# Patient Record
Sex: Female | Born: 1990 | Race: White | Hispanic: No | Marital: Single | Smoking: Current every day smoker
Health system: Southern US, Community
[De-identification: ages and names within clinical notes are randomized; demographics above are authoritative.]

## PROBLEM LIST (undated history)

## (undated) DIAGNOSIS — F845 Asperger's syndrome: Secondary | ICD-10-CM

## (undated) DIAGNOSIS — Z8774 Personal history of (corrected) congenital malformations of heart and circulatory system: Secondary | ICD-10-CM

## (undated) DIAGNOSIS — Z9889 Other specified postprocedural states: Secondary | ICD-10-CM

## (undated) DIAGNOSIS — K219 Gastro-esophageal reflux disease without esophagitis: Secondary | ICD-10-CM

## (undated) DIAGNOSIS — F849 Pervasive developmental disorder, unspecified: Secondary | ICD-10-CM

## (undated) DIAGNOSIS — F329 Major depressive disorder, single episode, unspecified: Secondary | ICD-10-CM

## (undated) DIAGNOSIS — F32A Depression, unspecified: Secondary | ICD-10-CM

## (undated) DIAGNOSIS — F319 Bipolar disorder, unspecified: Secondary | ICD-10-CM

## (undated) DIAGNOSIS — F419 Anxiety disorder, unspecified: Secondary | ICD-10-CM

## (undated) DIAGNOSIS — D821 Di George's syndrome: Secondary | ICD-10-CM

## (undated) HISTORY — PX: CARDIAC SURGERY: SHX584

---
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
## 2008-04-28 ENCOUNTER — Emergency Department (HOSPITAL_COMMUNITY): Admission: EM | Admit: 2008-04-28 | Discharge: 2008-04-28 | Payer: Self-pay | Admitting: Emergency Medicine

## 2008-12-15 ENCOUNTER — Other Ambulatory Visit: Payer: Self-pay | Admitting: Emergency Medicine

## 2008-12-16 ENCOUNTER — Inpatient Hospital Stay (HOSPITAL_COMMUNITY): Admission: AD | Admit: 2008-12-16 | Discharge: 2008-12-25 | Payer: Self-pay | Admitting: Psychiatry

## 2008-12-18 ENCOUNTER — Ambulatory Visit: Payer: Self-pay | Admitting: Psychiatry

## 2009-01-27 ENCOUNTER — Emergency Department (HOSPITAL_COMMUNITY): Admission: EM | Admit: 2009-01-27 | Discharge: 2009-01-28 | Payer: Self-pay | Admitting: Emergency Medicine

## 2009-08-29 ENCOUNTER — Emergency Department (HOSPITAL_COMMUNITY): Admission: EM | Admit: 2009-08-29 | Discharge: 2009-08-31 | Payer: Self-pay | Admitting: Emergency Medicine

## 2009-11-06 ENCOUNTER — Inpatient Hospital Stay (HOSPITAL_COMMUNITY): Admission: EM | Admit: 2009-11-06 | Discharge: 2009-11-15 | Payer: Self-pay | Admitting: Emergency Medicine

## 2009-11-26 ENCOUNTER — Emergency Department (HOSPITAL_COMMUNITY): Admission: EM | Admit: 2009-11-26 | Discharge: 2009-11-26 | Payer: Self-pay | Admitting: Emergency Medicine

## 2010-08-19 ENCOUNTER — Emergency Department (HOSPITAL_COMMUNITY): Admission: EM | Admit: 2010-08-19 | Discharge: 2010-08-19 | Payer: Self-pay | Admitting: Family Medicine

## 2010-10-20 IMAGING — CT CT HEAD W/O CM
1 series · 16 of 30 positions shown, 20 images · non-contrast
Comparison: None

CLINICAL DATA: Altered mental status.  Overdose.

CT HEAD WITHOUT CONTRAST
TECHNIQUE: Contiguous axial images were obtained from the base of
the skull through the vertex without contrast.

[Series 2: head_seq 4.5 h37s st · axial · 0.43mm/px · z∈[-94,+50]mm · 16 of 36 slices shown, 20 images]
[im 2/36  brain]
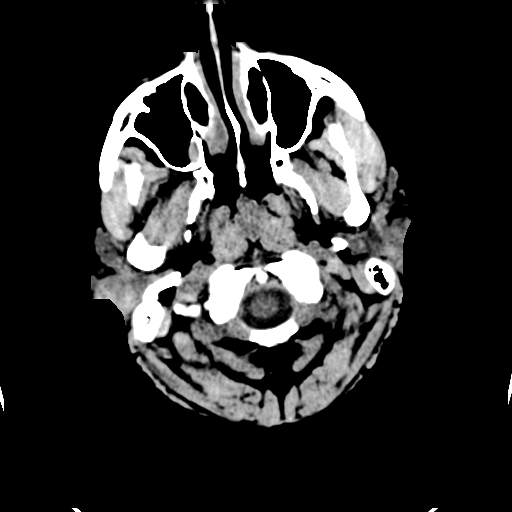
[im 2/36  bone]
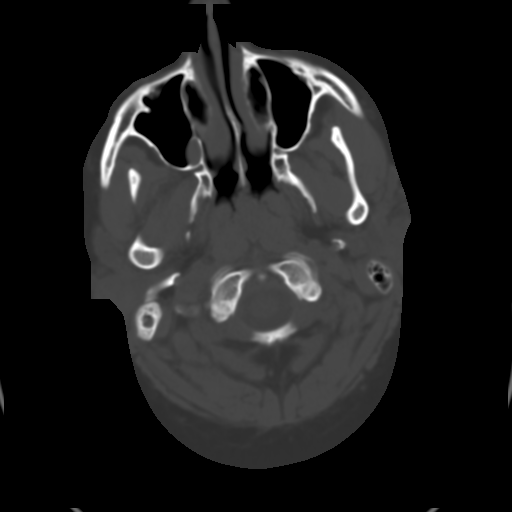
[im 4/36  brain]
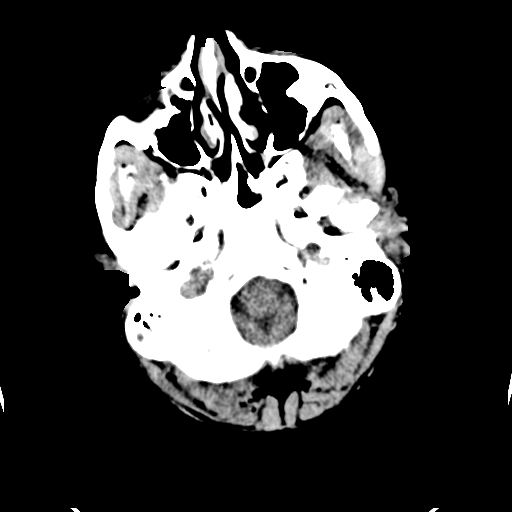
[im 7/36  brain]
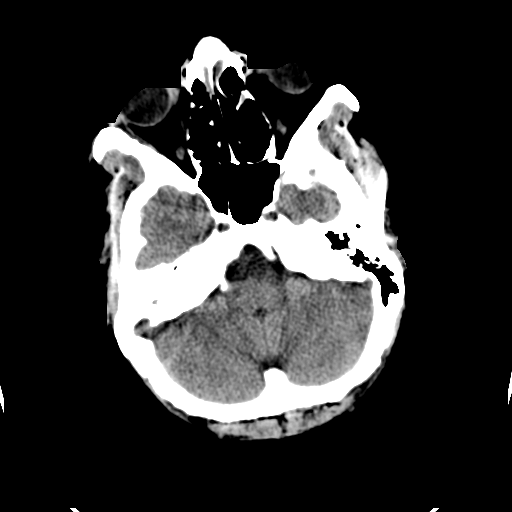
[im 9/36  brain]
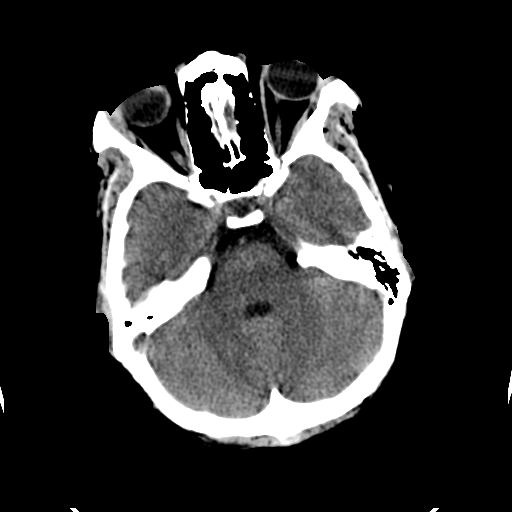
[im 10/36  brain]
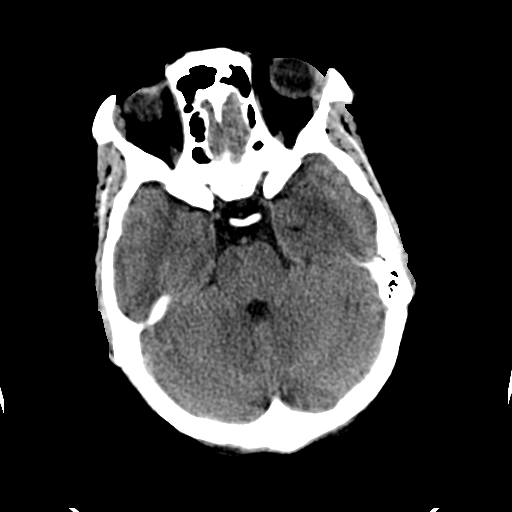
[im 10/36  bone]
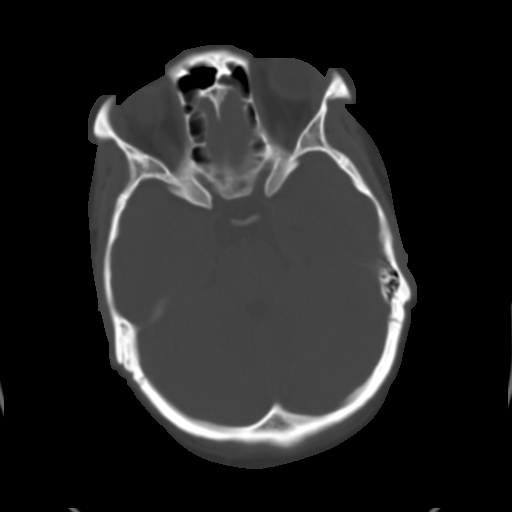
[im 13/36  brain]
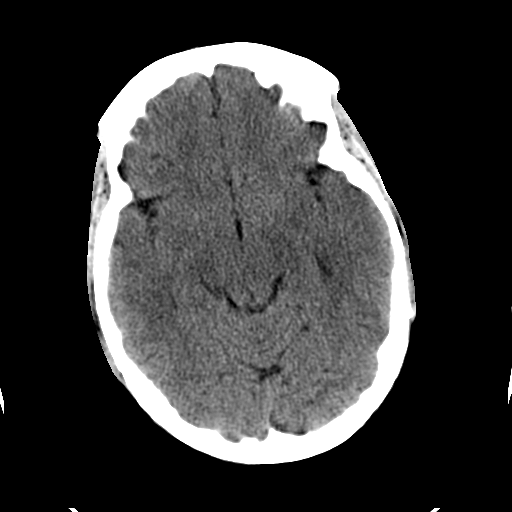
[im 15/36  brain]
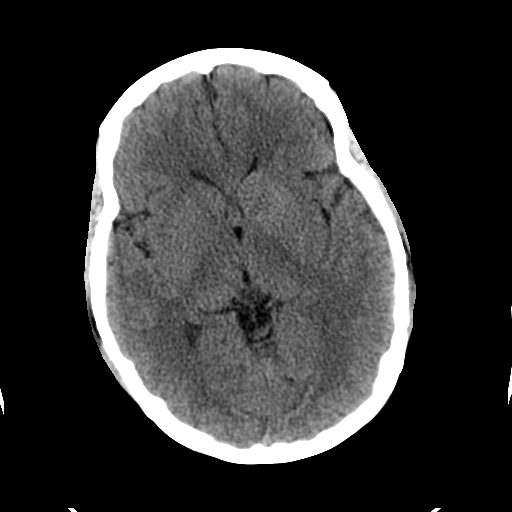
[im 17/36  brain]
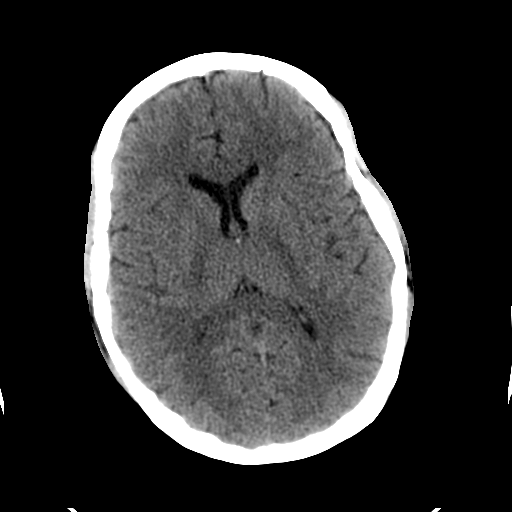
[im 19/36  brain]
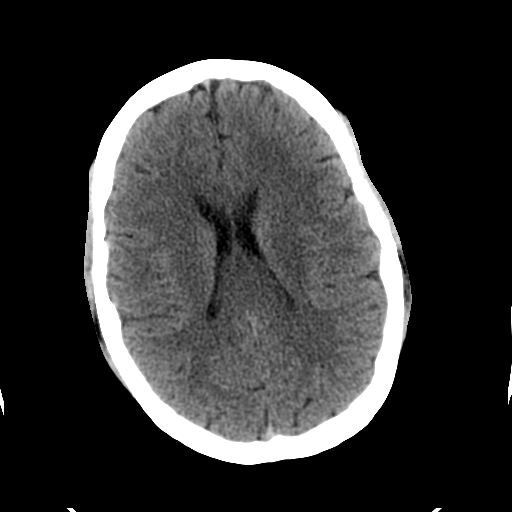
[im 19/36  bone]
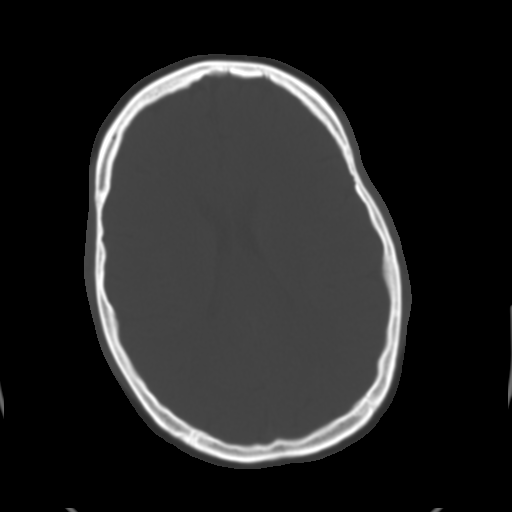
[im 21/36  brain]
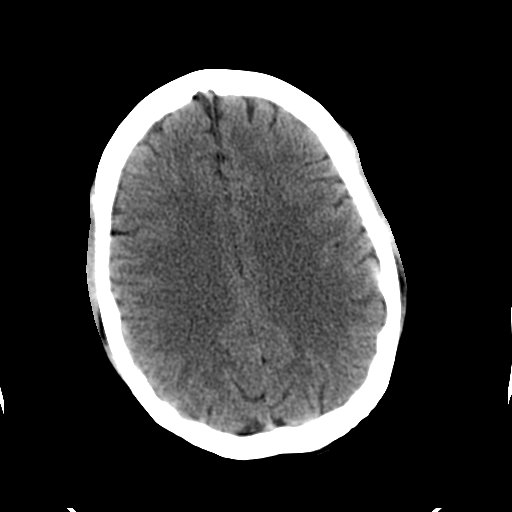
[im 23/36  brain]
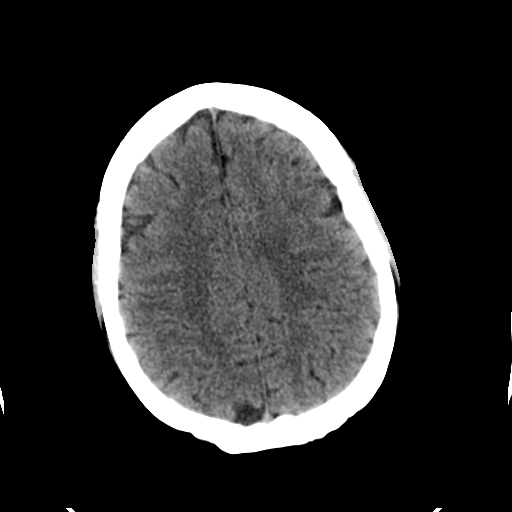
[im 26/36  brain]
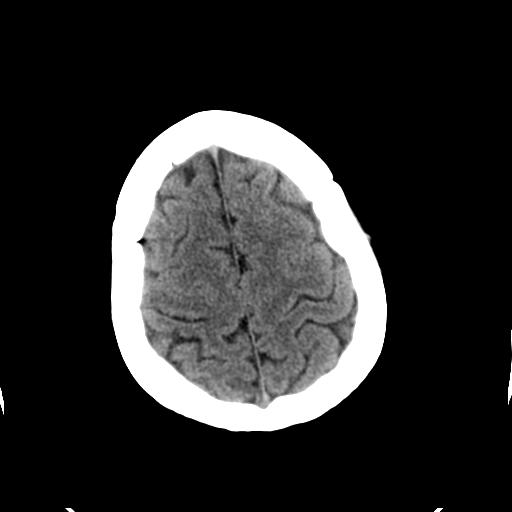
[im 27/36  brain]
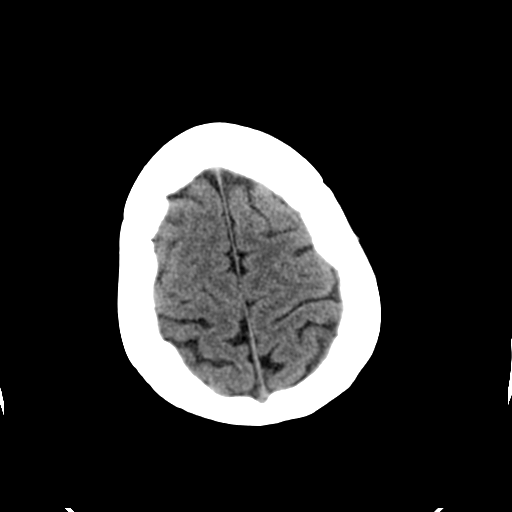
[im 27/36  bone]
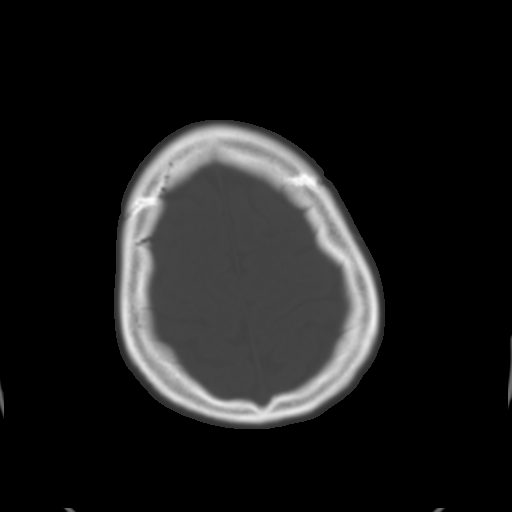
[im 29/36  brain]
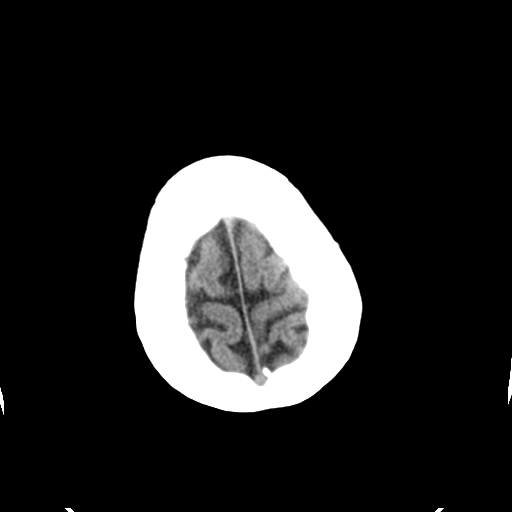
[im 32/36  brain]
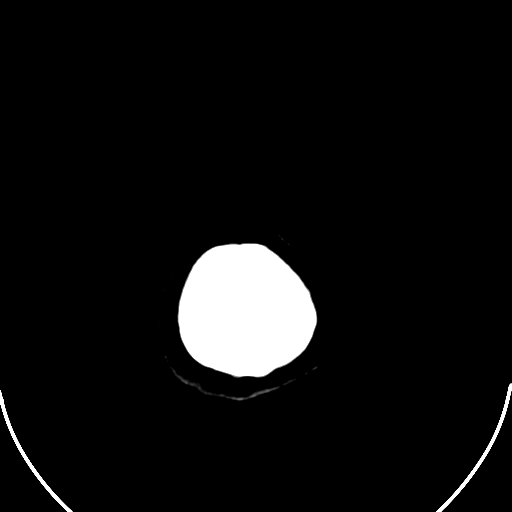
[im 34/36  brain]
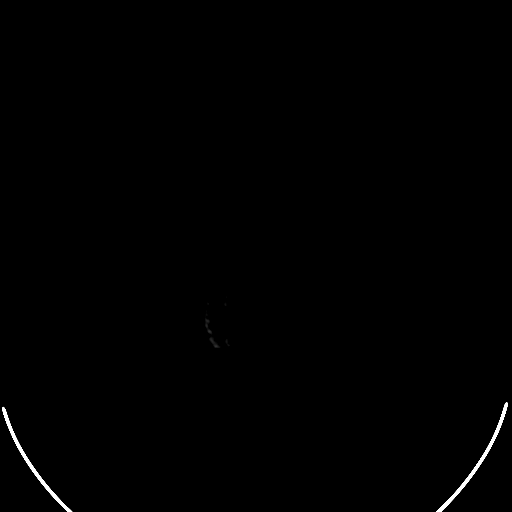

[16 of 30 positions shown; findings below may reference images not displayed]

FINDINGS: No acute intracranial abnormalities are identified,
including mass lesion or mass effect, hydrocephalus, extra-axial
fluid collection, midline shift, hemorrhage, or acute infarction.
Please note that acute infarction may be occult on CT for 24-48
hours.

The visualized bony calvarium is unremarkable.

Fluid within the right mastoid air cells and right middle ear
noted.
IMPRESSION: No evidence of acute intracranial abnormality.

Fluid within right mastoid air cells and right middle ear - may
represent right mastoiditis/otitis.

## 2010-11-29 ENCOUNTER — Emergency Department (HOSPITAL_BASED_OUTPATIENT_CLINIC_OR_DEPARTMENT_OTHER)
Admission: EM | Admit: 2010-11-29 | Discharge: 2010-11-29 | Payer: Self-pay | Source: Home / Self Care | Admitting: Emergency Medicine

## 2011-01-02 ENCOUNTER — Inpatient Hospital Stay (INDEPENDENT_AMBULATORY_CARE_PROVIDER_SITE_OTHER)
Admission: RE | Admit: 2011-01-02 | Discharge: 2011-01-02 | Disposition: A | Discharge status: Home / Self Care | Payer: Medicaid Other | Source: Ambulatory Visit | Attending: Family Medicine | Admitting: Family Medicine

## 2011-01-02 DIAGNOSIS — N390 Urinary tract infection, site not specified: Secondary | ICD-10-CM

## 2011-01-02 LAB — POCT URINALYSIS DIPSTICK
Ketones, ur: NEGATIVE mg/dL
Nitrite: NEGATIVE
Specific Gravity, Urine: >=1.03 (ref 1.005–1.030)
pH: 5.5 (ref 5.0–8.0)

## 2011-01-02 LAB — POCT PREGNANCY, URINE: Preg Test, Ur: NEGATIVE

## 2011-02-05 LAB — POCT RAPID STREP A (OFFICE): Streptococcus, Group A Screen (Direct): NEGATIVE

## 2011-02-24 LAB — COMPREHENSIVE METABOLIC PANEL
ALT: 12 U/L (ref 0–35)
AST: 18 U/L (ref 0–37)
AST: 21 U/L (ref 0–37)
Albumin: 3.7 g/dL (ref 3.5–5.2)
Alkaline Phosphatase: 99 U/L (ref 39–117)
BUN: 8 mg/dL (ref 6–23)
Calcium: 8.9 mg/dL (ref 8.4–10.5)
Chloride: 104 mEq/L (ref 96–112)
Chloride: 109 mEq/L (ref 96–112)
Creatinine, Ser: 0.61 mg/dL (ref 0.4–1.2)
GFR calc Af Amer: >60 mL/min (ref >60)
GFR calc Af Amer: >60 mL/min (ref >60)
GFR calc non Af Amer: >60 mL/min (ref >60)
Glucose, Bld: 89 mg/dL (ref 70–99)
Potassium: 3.9 mEq/L (ref 3.5–5.1)
Potassium: 3.9 mEq/L (ref 3.5–5.1)
Sodium: 137 mEq/L (ref 135–145)
Total Protein: 7.1 g/dL (ref 6.0–8.3)

## 2011-02-24 LAB — DIFFERENTIAL
Eosinophils Relative: 2 % (ref 0–5)
Monocytes Absolute: 0.5 10*3/uL (ref 0.1–1.0)

## 2011-02-24 LAB — HEPATIC FUNCTION PANEL
ALT: 12 U/L (ref 0–35)
AST: 16 U/L (ref 0–37)
Alkaline Phosphatase: 105 U/L (ref 39–117)
Bilirubin, Direct: 0.1 mg/dL (ref 0.0–0.3)
Indirect Bilirubin: 0.2 mg/dL — ABNORMAL LOW (ref 0.3–0.9)

## 2011-02-24 LAB — RAPID URINE DRUG SCREEN, HOSP PERFORMED
Benzodiazepines: NOT DETECTED
Cocaine: NOT DETECTED

## 2011-02-24 LAB — CBC
Hemoglobin: 12.3 g/dL (ref 12.0–15.0)
Hemoglobin: 12.5 g/dL (ref 12.0–15.0)
MCHC: 33.5 g/dL (ref 30.0–36.0)
MCV: 86.3 fL (ref 78.0–100.0)
Platelets: 171 10*3/uL (ref 150–400)
WBC: 6.6 10*3/uL (ref 4.0–10.5)

## 2011-02-24 LAB — URINE CULTURE: Colony Count: NO GROWTH

## 2011-02-24 LAB — URINALYSIS, ROUTINE W REFLEX MICROSCOPIC
Bilirubin Urine: NEGATIVE
Protein, ur: NEGATIVE mg/dL
Urobilinogen, UA: 0.2 mg/dL (ref 0.0–1.0)

## 2011-02-24 LAB — BLOOD GAS, VENOUS
Bicarbonate: 25.9 mEq/L — ABNORMAL HIGH (ref 20.0–24.0)
O2 Saturation: 71.7 %
TCO2: 22.8 mmol/L (ref 0–100)
pO2, Ven: 40.8 mmHg (ref 30.0–45.0)

## 2011-02-24 LAB — GLUCOSE, CAPILLARY: Glucose-Capillary: 94 mg/dL (ref 70–99)

## 2011-02-24 LAB — SALICYLATE LEVEL: Salicylate Lvl: <4 mg/dL (ref 2.8–20.0)

## 2011-02-24 LAB — POCT PREGNANCY, URINE: Preg Test, Ur: NEGATIVE

## 2011-02-24 LAB — ETHANOL: Alcohol, Ethyl (B): <5 mg/dL (ref 0–10)

## 2011-02-24 LAB — TRICYCLICS SCREEN, URINE: TCA Scrn: NOT DETECTED

## 2011-02-26 LAB — BASIC METABOLIC PANEL
BUN: 10 mg/dL (ref 6–23)
Creatinine, Ser: 0.6 mg/dL (ref 0.4–1.2)
Potassium: 3.3 mEq/L — ABNORMAL LOW (ref 3.5–5.1)
Sodium: 140 mEq/L (ref 135–145)

## 2011-02-26 LAB — DIFFERENTIAL
Basophils Absolute: 0 10*3/uL (ref 0.0–0.1)
Lymphocytes Relative: 16 % (ref 12–46)
Lymphs Abs: 1.4 10*3/uL (ref 0.7–4.0)
Monocytes Absolute: 0.8 10*3/uL (ref 0.1–1.0)
Neutro Abs: 6 10*3/uL (ref 1.7–7.7)

## 2011-02-26 LAB — RAPID URINE DRUG SCREEN, HOSP PERFORMED
Benzodiazepines: POSITIVE — AB
Cocaine: NOT DETECTED
Tetrahydrocannabinol: NOT DETECTED

## 2011-02-26 LAB — CBC
Hemoglobin: 13.1 g/dL (ref 12.0–15.0)
RBC: 4.48 MIL/uL (ref 3.87–5.11)
RDW: 12.9 % (ref 11.5–15.5)
WBC: 8.7 10*3/uL (ref 4.0–10.5)

## 2011-02-26 LAB — URINALYSIS, ROUTINE W REFLEX MICROSCOPIC
Glucose, UA: NEGATIVE mg/dL
Protein, ur: NEGATIVE mg/dL
Specific Gravity, Urine: 1.025 (ref 1.005–1.030)
Urobilinogen, UA: 0.2 mg/dL (ref 0.0–1.0)

## 2011-02-26 LAB — URINE MICROSCOPIC-ADD ON

## 2011-02-26 LAB — POCT PREGNANCY, URINE: Preg Test, Ur: NEGATIVE

## 2011-03-07 ENCOUNTER — Inpatient Hospital Stay (INDEPENDENT_AMBULATORY_CARE_PROVIDER_SITE_OTHER)
Admission: RE | Admit: 2011-03-07 | Discharge: 2011-03-07 | Disposition: A | Discharge status: Home / Self Care | Payer: Medicaid Other | Source: Ambulatory Visit | Attending: Family Medicine | Admitting: Family Medicine

## 2011-03-07 DIAGNOSIS — J40 Bronchitis, not specified as acute or chronic: Secondary | ICD-10-CM

## 2011-03-09 LAB — COMPREHENSIVE METABOLIC PANEL
ALT: 15 U/L (ref 0–35)
Albumin: 4.2 g/dL (ref 3.5–5.2)
Alkaline Phosphatase: 99 U/L (ref 39–117)
BUN: 7 mg/dL (ref 6–23)
Potassium: 3.8 mEq/L (ref 3.5–5.1)
Sodium: 138 mEq/L (ref 135–145)
Total Protein: 7.7 g/dL (ref 6.0–8.3)

## 2011-03-09 LAB — URINALYSIS, ROUTINE W REFLEX MICROSCOPIC
Bilirubin Urine: NEGATIVE
Ketones, ur: NEGATIVE mg/dL
Nitrite: NEGATIVE
Urobilinogen, UA: 0.2 mg/dL (ref 0.0–1.0)

## 2011-03-09 LAB — DIFFERENTIAL
Basophils Absolute: 0 10*3/uL (ref 0.0–0.1)
Basophils Relative: 0 % (ref 0–1)
Eosinophils Absolute: 0.7 10*3/uL (ref 0.0–0.7)
Eosinophils Relative: 6 % — ABNORMAL HIGH (ref 0–5)
Lymphs Abs: 1.2 10*3/uL (ref 0.7–4.0)
Monocytes Absolute: 1 10*3/uL (ref 0.1–1.0)
Monocytes Relative: 9 % (ref 3–12)
Myelocytes: 0 %
Neutro Abs: 8.4 10*3/uL — ABNORMAL HIGH (ref 1.7–7.7)
Neutrophils Relative %: 74 % (ref 43–77)

## 2011-03-09 LAB — CBC
Platelets: 185 10*3/uL (ref 150–400)
RDW: 13.4 % (ref 11.5–15.5)

## 2011-03-09 LAB — RAPID URINE DRUG SCREEN, HOSP PERFORMED
Benzodiazepines: NOT DETECTED
Cocaine: NOT DETECTED
Tetrahydrocannabinol: NOT DETECTED

## 2011-03-09 LAB — ETHANOL: Alcohol, Ethyl (B): <5 mg/dL (ref 0–10)
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
## 2011-04-10 NOTE — Discharge Summary (Signed)
Judy Burns, Judy Burns          ACCOUNT NO.:  [id number]   MEDICAL RECORD NO.:  [id number]          PATIENT TYPE:  INP   LOCATION:  0101                          FACILITY:  [facility]   PHYSICIAN:  Lalla Brothers, MDDATE OF BIRTH:  [birth year]   DATE OF ADMISSION:  12/16/2008  DATE OF DISCHARGE:  12/25/2008                               DISCHARGE SUMMARY   IDENTIFICATION:  This 20 year old female 11th grade student at [employer] was admitted emergently voluntarily initially to  the adult psychiatric unit from [facility]  Department, but then transferred to the adolescent psychiatric unit 24  hours after admission for inpatient stabilization and treatment of  suicide risk, depression, and anxious and regressive fixations on the  death of more relatives and the meaning for current and future  relationships.  For full details, please see the typed admission  assessment.   SYNOPSIS OF PRESENT ILLNESS:  The patient has complicated outpatient  therapy relative to her developmental limitations, multiple deaths with  bereavement, and ambivalence about individuation separation.  The  patient has consolidated most of her care at [facility] since her  last hospitalization at [facility] in November 2006 and  one prior to that in April 2005.  She had an inpatient stay at [facility]  in 20-Apr-2008.  The patient formulates that she must be a devil worshiper in  that she must have guilt for the death of 3 relatives or relatives to  be.  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  Mother  and patient have both been grieving the death of maternal grandfather  dying of cancer in 09-29-09maternal aunt's death from a  narcotic  overdose in 05/29/2009and maternal uncle's girlfriend dying 24 hours  after and in the room next to maternal grandfather.  The patient again  blamed herself for the death, feeling that confronting the aunt's  problems may have contributed to her overdose.  The patient questioned  whether she herself was alive or dead and had similar fantasies about  the aunt.  Mother and the patient interpreted that the patient needed  more therapy and had more mental illness.  Mother did indicate that the  patient's IQ had been retested in Sep 29, 2008at 41, having been  measured at 46 in the past.  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  The patient has been  treated in the past with Klonopin, Risperdal, Trileptal, Abilify,  Celexa, Lunesta and Seroquel, being at the time of admission on Zoloft  75 mg at bedtime and trazodone 100 mg at bedtime.  IQ had reportedly  been 68 in 1996-04-20.  The patient has had phonological disorder, ADHD  symptoms and Asperger  symptoms in addition to her mild mental  retardation, though mother significantly denies limitations, especially  when working with the patient.   INITIAL MENTAL STATUS EXAM:  The patient is right-handed.  She has  cognitive slowing and speech limitations.  She appeared depressed,  involuted, regressed and infantile.  She had mood lability but no  current mania.  She seems to regress most when the family becomes over-  nurturing.  She did not manifest overt psychosis.  She did have suicidal  ideation to overdose or cut herself and has a history of cutting and  overdosing and reports some purging.   LABORATORY FINDINGS:  CBC in the emergency department was normal except  white count elevated at 11,300 with upper limit of normal 10,500, and  there were 6%  eosinophils with upper limit of normal 5.  Hemoglobin was  normal at 13, MCV at 85.9 and platelet count 185,000.  Comprehensive  metabolic panel was normal with sodium 138, potassium 3.8, random  glucose 103, creatinine 0.52, calcium 9.4, AST 19 and ALT 15.  Blood  alcohol was negative as was urine drug screen.  Urinalysis revealed  large amount of occult blood from menses with specific gravity of 1.014,  otherwise clear, with microscopic exam revealing too numerous to count  RBC and rare epithelial.  Urine pregnancy test was negative.   HOSPITAL COURSE AND TREATMENT:  General medical exam in the emergency  department was sufficient for the patient's hospitalization on the adult  psychiatric unit from which she was transferred to the adolescent  psychiatric unit.  She is overweight and had superficial lacerations  self-inflicted to both forearms.  She had been cutting herself and  attempted to take pills as an overdose at a friend's house who called  mother relative to securing patient's transportation to the emergency  department.  Vital signs were normal throughout hospital stay with  maximum temperature 99.5 three days prior to discharge.  Her height was  62 inches and weight was 97.1 kg on admission, becoming 99 kg by  discharge.  Initial supine blood pressure was 113/68 with heart rate of  61, and standing blood pressure 138/79 with heart rate of 81.  At the  time of discharge on discharge medication, supine blood pressure was  113/68 with heart rate of 58, and standing blood pressure 113/78 with  heart rate of 71.  The patient was initially fixated on whether she was  alive or dead, though by the second hospital day she was asking for  something to help her breathe better.  Mother noted the patient seeks  inhalers as do peers at school relative to substance abuse proclivity,  though the patient has no substance abuse.  The patient was comfortable  with a normal saline nasal inhaler  and felt this helped her breathe very  well during hospital stay.  As she was reminded that her need to breathe  means she is alive, the patient gradually became engaged in milieu and  group therapies with improved relations with peers and self-  satisfaction.  but also increasing dependence on relationships in the  hospital.  Mother was faced at the same time with introducing the  patient to respite care at a medical nursing home by admitting her there  from the hospital.  The patient was initially regressing and then  oppositionally protesting such placement plans, but by the time of  discharge she was accepting and cooperative and such.  Mother noted that  she had  suddenly resolved her grief shortly before the patient's  admission.  The patient gradually relinquished her fixation on grief  with its tactile, visual and auditory delusions of the deceased aunt in  the course of the hospital stay.  She became more interested in peer-  based activities and discussions and felt the need to continue at the  hospital relative to the achievement socially.  The patient and mother  therefore attempted to maintain aftercare therapies with inpatient  staff, but such was declined, as the patient and mother were assured  that their progress was real and need not be propped up or supported  other than by existing therapies and services from prior to admission.  The patient was discharged free of suicidal ideation on Zoloft 150 mg  every bedtime, and her trazodone, coping much more effectively.  Mother  had less expressed emotion but was still validating of the patient's  primitive fantasy intrusions at the time of discharge, though the  patient was slowly relinquishing these.  She required no seclusion or  restraint during the hospital stay.  Over the course of the  hospitalization it seemed clear that she had developed a major  depressive episode superimposed on her grief and that her generalized   anxiety had exacerbated.  She was not considered clinically psychotic.   FINAL DIAGNOSES:  Axis I:  A.  Major depression recurrent, severe  B.  Generalized anxiety disorder.  C.  Attention deficit hyperactivity disorder, not otherwise specified,  by history (provisional diagnosis).  D.  Other interpersonal problem.  E.  Parent child problem.  F.  Other specified family circumstances.  G.  Noncompliance with psychotherapies.  Axis II:  A.  Mild mental retardation.  B.  Phonological disorder by history.  C.  Pervasive developmental disorder, not otherwise specified  (provisional diagnosis).  Axis III:  A.  Obesity.  B.  Tetralogy of Fallot, status post surgery, now with right bundle  branch block.  C.  Right hemispheric stroke with focal seizures at the time of cardiac  surgery at age 30 or 17.  D.  Velocardiofacial syndrome (DiGeorge).  E.  Asthma.  F.  Acne.  G.  Allergy to carrots manifested by rash and itching.  Axis IV:  Stressors:  Family severe, acute and chronic; medical severe,  acute and chronic; phase of life extreme, acute and chronic; school  severe, acute and chronic.  Axis V:  GAF on admission 30 with highest in the last year 54 and  discharge GAF was 47.   PLAN:  The patient was discharged to mother in improved condition free  of suicide and homicide ideation.  She follows a weight-control diet and  has no restrictions on physical activity other than to abstain from any  self-injury or substance use.  She requires no wound care or pain  management at the time of discharge.  She is encouraged to disengage  from the intensity of grief counseling as she has begun to accept, as  mother has, the loss of the aunt, and hopefully can transfer her focus  in therapy to real life social skill and relational building, as this  has been her area of interest in the hospital stay.   She is discharged on the following medication:  1. Zoloft 100 mg tablet, taking 1-1/2 tablets  every bedtime, quantity      #45 with no refill prescribed.  2. Trazodone 100 mg every bedtime, quantity #30 with no refill  prescribed.   They see Dr. Milus Glazier for therapy next on December 25, 2008 at 1700 at  [phone].  They see Dr. Esaw Dace for psychiatric followup January 21, 2009 at  1630 at [phone].  She does not need the normal saline nasal spray at  the time of discharge.      Lalla Brothers, MD  Electronically Signed     GEJ/MEDQ  D:  12/27/2008  T:  12/28/2008  Job:  [id number]   cc:   [employer] Psychiatry Department [facility] [place], [place] [address]  Fax [phone]
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
## 2011-04-10 NOTE — H&P (Signed)
Judy Burns, Judy Burns          ACCOUNT NO.:  [id number]   MEDICAL RECORD NO.:  [id number]          PATIENT TYPE:  INP   LOCATION:  0104                          FACILITY:  [facility]   PHYSICIAN:  Lalla Brothers, MDDATE OF BIRTH:  1991/04/04   DATE OF ADMISSION:  10/04/2005  DATE OF DISCHARGE:                         PSYCHIATRIC ADMISSION ASSESSMENT   IDENTIFICATION:  A 20 year old female, 9th grade student at [employer], is admitted emergently, voluntarily in transfer from [facility] Emergency Department for inpatient stabilization and treatment  of Lunesta overdose suicide attempt that is confusing as to reason and  mechanism.  The patient reportedly overdosed with 22 tablets of Lunesta 3 mg  each between 12 and 48 hours prior to admission.  She reported a progressive  fear of drug dealers and Goth peers recently so that she had not slept for 3  days.  She simultaneously clarifies that school is a Designer, television/film set for  her self esteem and documentation of her function, at the same time that she  is reporting that these threatening type peers are killing her.   HISTORY OF PRESENT ILLNESS:  The patient presents with a chief concern of  being manic, though as she is admitted, her Celexa is maintained by Dr.  Katrinka Blazing at 20 mg nightly and Lunesta is maintained at 3 mg nightly.  The  patient has known me from hospitalization at the [facility]  February 21, 2004 through February 26, 2004, at which time she had unspecified  psychotic symptoms and significant anxiety.  Mother provides some updates or  changes in the patient's background and chronological history at the time of  this readmission.  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  The mother reports that the patient's IQ is either 53, 67, or  55, rather than the previous 31, though it is difficult to  determine whether  mother is aware that she reported the IQ of 4 from testing in 1997 at the  time of the last admission in March of 2005.  Mother is more clarifying of  the patient's medical trauma and complications in the past.  The patient  does not have cleft lip often associated with velocardiofacial syndrome, but  she does have learning difficulties and cardiac difficulties as well as  anxious difficulties that could be associated.  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  She had a right hemispheric stroke, according to mother,  perioperatively or postoperatively, which may have been the source of focal  seizures that apparently occurred for years,  according to mother.  They  emphasize the goal of 40 pound weight reduction over the next year at the  time of admission, though the patient has reduced her weight from last  hospitalization at 186 pounds to current 175 pounds, therefore 11 pounds  over a year and a half.  The patient is obsessive, agitated, and overwhelmed  with her thoughts on this admission as she had  been last admission.  She is  not expansive, grandiose or elevated in mood.  Rather she is uncomfortable,  fearful, and stressed as she describes paranoid delusions that are vague and  poorly formed but organized around [place] peers and drug dealers.  The patient  is admitted assessing whether sleep and functioning can be restored in the  inpatient milieu and programming.  The patient had been discharged on  Abilify 15 mg nightly in addition to Zoloft 25 mg daily as of her last  hospitalization in March to April of 2005.  She apparently continues therapy  with Terrial Rhodes as well as continuing her medical care with Dr. Octavio Graves in [facility].  Dr. Ellamae Sia had phoned during the patient's  last hospitalization with diagnoses and treatment targets.  The patient was  working outpatient with  Dr. Elmo Putt since 2003 at the time of her last  hospitalization.  She had an early diagnosis of ADHD not otherwise  specified.  The patient has not received an in-hospital diagnosis of mania  or depression, though mother indicates she herself has had bipolar  depression in the past and seems to expect the patient will have the same.  The patient is on Celexa 20 mg every bedtime and Lunesta 3 mg every bedtime  at the time of admission, with the Lunesta present for the last 2 months.  The patient does value opening up and talking about her stressors, though  she becomes disorganized and anxious as she does so.  Still, she seems to  start some preparations for stabilization as she discusses such.  Dr.  Milford Cage is monitoring the patient's medications currently on an  outpatient basis.  In the past, the patient has been treated with Klonopin,  Risperdal, Trileptal and then the Abilify and Zoloft.  They do not clarify  why the Abilify was stopped but it may have related to weight gain and  potential seizure diathesis.  The patient may have also improved in the  interim.  She uses no alcohol or illicit drugs.  She is not sexualized in  her behavior or in her wishes or desires for the future.  She lives with  mother and mother's fiance.  She had speech therapy in the past.  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  The patient does not open up and  discuss all of these health concerns.   PAST MEDICAL HISTORY:  The patient is also under the medical care of Dr.  Carlean Purl and Dr. Margaretmary Lombard.  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  The patient apparently had cardiac failure at the  time of  surgery.  She is currently menstruating and menarche was at age 46.  She is  not sexually active.  She is trying to lose 40 more pounds over the next  year.  She has had some asthma in the past, though she is requiring no  treatment now.  She has no medication allergies.  She is on Celexa 20 mg  every bedtime and Lunesta 3 mg every bedtime.   REVIEW OF SYSTEMS:  The patient denies difficulty with gait, gaze or  continence currently.  She denies exposure to communicable disease  or  toxins.  She denies rash, jaundice or purpura.  There is no chest pain,  palpitations, or presyncope currently.  There is no dyspnea, cough or  palpitations.  There is no abdominal pain, nausea, vomiting or diarrhea.  There is no dysuria or arthralgia.   Immunizations are up to date.   FAMILY HISTORY:  The patient resides with mother and mother's fiance.  Mother reports having bipolar depression herself in the past.  Mother is not  definitely on medications currently.  They do not offer any information  about the patient's biological father.   SOCIAL AND DEVELOPMENTAL HISTORY:  The patient is a 9th grade student at  [employer].  She had speech therapy in the past.  Mother  reported an IQ in 1997 of 44, according to the patient's last admission in  March of 2005.  Mother reports values of 78, 79 and 89 as recorded in the  nursing intake during this admission.  The patient is not sexualized in  behavior nor does she have sexualized interests.  She is currently anxious,  particularly about peers in school.  She suggests that [place] and drug dealer  type peers frighten her severely and now she is afraid they are going to  harm her in some way.  She has no legal charges.  She uses no alcohol or  illicit drugs herself.   ASSETS:  The patient is seeking direction and containment as well as  reassurance and education.   MENTAL STATUS EXAM:  Height is 60 inches, down from 62 inches  measured at  the time of her last hospitalization.  Weight is 175 pounds, down from 186  pounds at the time of her last admission.  Blood pressure is 129/90 with  heart rate of 92 sitting and 137/85 with heart rate of 90 standing.  The  patient is alert.  She was considered disoriented at the time of admission  but she currently is oriented to person, place, time and situation.  However, the patient is disorganized in thought.  Muscle strength and tone  are normal.  There are no pathological reflexes or soft neurologic findings.  There are no abnormal involuntary movements.  Alternating motion rates are  intact.  Gait and gaze are intact.  The patient is agitated and anxious,  with some disorganized flight of ideas and paranoid delusions, as well as  paranoia seeming likely.  She is sleep deprived.  She did allow labs to be  drawn in the emergency department after refusing any venipuncture during her entire hospitalization in 2005.  She has had an apparent suicide attempt.  She does not clarify thoughts or commands surrounding this.  However, she  does acknowledge being overwhelmed with her own fears and her thoughts of  how others are going to be harmful to her.  She does not have grandiose  delusions nor erotomanic delusions.  She does not present significant  evidence of mania, as much as she seems to present unspecified psychosis  with paranoid delusions and a history of generalized and organic anxiety.  Her suicidality seems to be associated with these continued symptoms.   IMPRESSION:  AXIS 1:  1.  Psychotic disorder not otherwise specified.  2.  Generalized anxiety disorder.  3.  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  4.  History of attention deficit hyperactivity disorder not otherwise  specified.  5.  Rule out bipolar disorder, manic with psychotic symptoms  (provisional      diagnosis).  6.  Other interpersonal problem.  7.  Other specified family circumstances.  AXIS II:  1.  Rule out Asperger's  syndrome (provisional diagnosis).  2.  Rule out mild mental retardation (provisional diagnosis).  3.  Rule out learning disorder not otherwise specified (provisional      diagnosis).  4.  Rule out history of phonological disorder.  AXIS III:  1.  Lunesta overdose.  2.  Status post cardiac surgery for tetralogy of Fallot.  3.  History of perioperative or postoperative right hemispheric stroke at      the time of cardiac surgery with possible subsequent focal seizures.  4.  Overweight.  5.  History of asthma.  6.  History of acne.  AXIS IV:  Stressors:  Family moderate, acute and chronic; medical severe,  acute and chronic; phase of life extreme, acute and chronic; school severe,  acute and chronic.  AXIS V:  Global assessment of function on admission 28 with highest in last  year 54.   PLAN:  The patient is admitted for inpatient adolescent psychiatric and  multidisciplinary, multimodal behavioral health treatment in a team-based  programmatic locked psychiatric unit.  We will consider a typical  antipsychotic again, it is allowed, though, this may have been  contraindicated either for weight, cardiac, or clinical improvement as  discussed previously by Dr. Ellamae Sia.  Will need to stop Celexa if the patient  is definitely manic.  At this time, will monitor and provide  psychotherapeutic stabilization to determine other medication management  absolutely necessary.  Cognitive behavioral therapy, coping with chronic  illness, identity consolidation, individuation-separation, learning  strategies, desensitization, and family therapy can be undertaken.  Estimated length of stay is 7 days with target symptoms for discharge being  stabilization of suicide risk and psychotic symptoms, stabilization of mood and anxiety symptoms, and generalization of  the capacity for safe and  effective participation in outpatient treatment.      Lalla Brothers, MD  Electronically Signed     GEJ/MEDQ  D:  10/05/2005  T:  10/05/2005  Job:  [id number]

## 2011-04-10 NOTE — Discharge Summary (Signed)
Judy Burns, Judy Burns                      ACCOUNT NO.:  [id number]   MEDICAL RECORD NO.:  [id number]                   PATIENT TYPE:  INP   LOCATION:  0454                                 FACILITY:  [facility]   PHYSICIAN:  Beverly Milch, MD                  DATE OF BIRTH:  [birth year]   DATE OF ADMISSION:  02/21/2004  DATE OF DISCHARGE:  02/26/2004                                 DISCHARGE SUMMARY   IDENTIFICATION:  A [age]-year[age]old female, 7th grade student at [employer], was admitted emergently, voluntarily on referral from Dr. Lamar Blinks for inpatient stabilization of paranoid delusions and obsessive and  generalized anxiety upon which she was beginning to act, including by  carrying a knife to combat the things that were after her.  This was her  first psychiatric hospitalization and she has worked with Dr. Nicholaus Bloom since  March 21, 2002, though mother has been lost to follow-up during the winter  and has not been complying with Trileptal which she has reduced from 600 to  300 mg nightly.  For full details please see the typed admission assessment.   SYNOPSIS OF PRESENT ILLNESS:  Mother and patient have been diligent in their  compliance with cardiology and genetics follow-ups, as confirmed by Dr.  Ellamae Sia by phone.  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  The patient has a previous  diagnosis of ADHD and has been treated in the past with Klonopin and  Risperdal.  Mother has depression possibly of the bipolar type as well as  panic attacks, and father has a substance abuse history.  The patient has a  measured IQ of 68 in 1997 according to mother, and mother at various times  states the patient functions at a 5th grade but then subsequently at an 20-  year-old level.  The patient had cardiac surgery for Tetralogy of Fallo at 79  months of age and is quite phobic about having venipuncture, preferring to  do the test  herself.  She had menarche at age 20 and has gained excessive  weight.  She has a history of focal seizures in the past but has been free  of any seizure activity for many years.   INITIAL MENTAL STATUS EXAM:  The patient was circumstantial and obsessively  over elaborating her fears at the time of admission and stated she did not  sleep at all the first night, as she was anticipating venipuncture the next  morning.  The patient was admitted to the children's unit instead of the  adolescents' unit by staff decision.  She was noted to have significant  dependence upon mother for activities for which she is responsible as well  as relational needs.  She described persecutory and paranoid delusions and  was vigilant.  She did not have acknowledged auditory or visual  hallucinations though mother suspected such.  LABORATORY FINDINGS:  Only a urinalysis was completed relative to admission  labs.  Two separate attempts at venipuncture were carried out, including by  preparations of having mother present for the second one and most  experienced laboratory phlebotomy staff.  Though the patient did allow 2  separate attempts at venipuncture the second time, the vein was not accessed  except for a few drops of blood and not enough to carry out laboratory  testing of a routine nature to assure internal organ preparedness for  pharmacotherapy.  Mother therefore requested that any blood work necessary  be deferred to Dr. Carma Leaven lab and she thinks Dr. Ellamae Sia may have some  blood stored that would be applicable to assuring hepatic, hematopoietic and  other organ and metabolic functional capacity for the patient to take her  antipsychotic medications.  She did have a normal urinalysis at the  [facility], with specific gravity of 1.019.  EKG was carried  out when stable on 15 mg of Abilify nightly and 25 mg of Zoloft in the  morning.  The patient's conduction intervals were safe to  continue these  medications, with a QTC of 404 milliseconds.  She did have abnormalities on  the EKG suggestive of right ventricular hypertrophy and stain and left  atrial enlargement, though the study was performed only to assess conduction  variables relative to psychotropic medications.   HOSPITAL COURSE AND TREATMENT:  General medical exam by [facility],  PA-C noted no medication allergies.  The patient had a right upper extremity  fracture in the past.  The patient had watched The Ring movie and was  fixated on violent themes prior to admission.  The patient had scars from  previous cutdowns of the left wrist and open heart surgery.  She had  menarche at age 50, with irregular menses.  She has headaches when she cries  and tantrums when she is upset.  She is overweight.  Her admission weight  was 186 pounds with height of 62 inches, blood pressure 101/62 with heart  rate of 76 sitting and 126/73 with heart rate of 129 standing.  The patient  was started on Zyprexa the first night at 5 mg nightly and tolerated this  well but was switched to Abilify after considering all the variables and  discussing with Dr. Nicholaus Bloom.  Trileptal was discontinued and Abilify was  titrated up to 15 mg nightly.  She was started on a low dose of Zoloft 25 mg  every morning though with a history that she had been over activated and  destabilized when taking Prozac remotely in the past, before even Risperdal  or Klonopin.  By February 24, 2004, the patient became significantly improved.  Her obsessive and persecutory delusional fixations were significantly  contained.  She became much more social and verbal in her participation in  all aspects of treatment.  Mother was pleased with the patient's improvement  and safety and stability.  The patient was able to sustain this for 2 more  days and was discharged in improved condition.  The patient was able to review the letter she wrote me from the evening of  admission in a somewhat  mechanical way, though addressing the persecutory fears and obsessive over  interpretations with the ability to formulate by the time of discharge but  she could use the cognitive behavioral and interpersonal coping skills that  she had acquired during the hospital treatment to deal with relapses in her  symptoms should  such occur.  She had no seizure activity during hospital  stay.  By the time of discharge, the patient's weight was down to 181  pounds.  Her blood pressure was 111/73 with heart rate of 91 supine and  standing blood pressure 108/78 with heart rate of 78.  She had no suicidal  or homicidal ideation.  She had 48-72 hours of sustained improvement and was  discharged to mother in improved condition.   FINAL DIAGNOSES:  AXIS 1:  1. 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  2. Generalized anxiety disorder with obsessive features, to rule out     obsessive-compulsive disorder.  3. Other specified family circumstances.  4. Relative noncompliance with treatment.  5. Parent-child problem.  AXIS II:  1. Mild mental retardation by history.  2. 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  AXIS III:  1. Velocardiofacial syndrome.  2. History of partial seizures.  3. Overweight.  4. Asthma by history.  5. Mild acne.  6. Status post surgery for Tetralogy of Fallo.  AXIS IV:  Stressors:  Family - moderate to severe, acute and chronic; medical - severe  to extreme, chronic; phase of life - extreme, acute and chronic; school -  severe, acute and chronic.  AXIS V:  Global assessment of function on admission 35 with highest in last year 54  and discharge global assessment of function was 48.   PLAN:  The patient was discharged in improved condition with significant  improvement.  Assessment of Asperger's features was difficult considering  her Velocardiofacial  syndrome diagnosis  as well as her admission for severe  obsessive anxiety and persecutory delusions.  She was improved over the 48  hours prior to discharge and had significant social skills and interests  that were not evident at the time of admission.  She coped much better with  addressing all aspects of her functional problems including with mother at  the time of discharge.  She seemed less dependent on mother at the time of  discharge and the patient was proud herself of her progress during hospital  stay.  She was discharged on the following medications:  1. Zoloft 25 mg every morning, quantity #30 with no refill prescribed.  2. Abilify 15 mg every bedtime, quantity #30 with no refill prescribed.  Her Trileptal was discontinued.  She will see Dola Factor March 17, 2004  at 0900.  She sees Dr. Karel Jarvis March 10, 2004 at 1300 and mother  thinks Dr. Ellamae Sia may have some blood frozen that can be used to assure that the patient has internal organ competence for tolerating pharmacotherapy.  She will see Dr. Lamar Blinks February 29, 2004 at 1420 for psychiatric follow-up.  Crisis and safety plans are outlined.  Weight control diet is outlined and  she has previously established activity levels from cardiology.  There is a  signed release in the chart for the courtesy copies.                                               Beverly Milch, MD    GJ/MEDQ  D:  02/27/2004  T:  02/27/2004  Job:  [id number]   cc:   Attn: Dr. Lamar Blinks and Dola Factor [facility]  [address]  [place],  [place] [address]  fax:  [phone]   Joella Prince, MD  [address]  [place], [place] [address]  fax:  [phone]

## 2011-07-15 ENCOUNTER — Emergency Department: Payer: Self-pay | Admitting: *Deleted

## 2011-09-03 ENCOUNTER — Emergency Department: Payer: Self-pay | Admitting: *Deleted

## 2011-12-23 ENCOUNTER — Inpatient Hospital Stay: Payer: Self-pay | Admitting: Psychiatry

## 2011-12-23 LAB — COMPREHENSIVE METABOLIC PANEL
Alkaline Phosphatase: 90 U/L (ref 50–136)
Anion Gap: 11 (ref 7–16)
BUN: 9 mg/dL (ref 7–18)
Bilirubin,Total: 0.3 mg/dL (ref 0.2–1.0)
Calcium, Total: 9.1 mg/dL (ref 8.5–10.1)
Chloride: 105 mmol/L (ref 98–107)
Co2: 23 mmol/L (ref 21–32)
EGFR (African American): >60
EGFR (Non-African Amer.): >60
Osmolality: 277 (ref 275–301)
Potassium: 3.6 mmol/L (ref 3.5–5.1)

## 2011-12-23 LAB — CBC
HGB: 12.6 g/dL (ref 12.0–16.0)
MCH: 30.4 pg (ref 26.0–34.0)
MCHC: 33.8 g/dL (ref 32.0–36.0)
MCV: 90 fL (ref 80–100)
Platelet: 85 10*3/uL — ABNORMAL LOW (ref 150–440)
RBC: 4.14 10*6/uL (ref 3.80–5.20)

## 2011-12-23 LAB — URINALYSIS, COMPLETE
Leukocyte Esterase: NEGATIVE
Nitrite: NEGATIVE
Ph: 8 (ref 4.5–8.0)
Protein: NEGATIVE
RBC,UR: 1 /HPF (ref 0–5)

## 2011-12-23 LAB — DRUG SCREEN, URINE
Amphetamines, Ur Screen: NEGATIVE (ref ?–1000)
Cannabinoid 50 Ng, Ur ~~LOC~~: NEGATIVE (ref ?–50)
Tricyclic, Ur Screen: POSITIVE (ref ?–1000)

## 2011-12-23 LAB — PREGNANCY, URINE: Pregnancy Test, Urine: NEGATIVE m[IU]/mL

## 2011-12-23 LAB — ETHANOL: Ethanol: <3 mg/dL

## 2011-12-23 LAB — TSH: Thyroid Stimulating Horm: 1.71 u[IU]/mL

## 2011-12-24 LAB — LIPID PANEL
Cholesterol: 122 mg/dL (ref 0–200)
HDL Cholesterol: 32 mg/dL — ABNORMAL LOW (ref 40–60)
Triglycerides: 132 mg/dL (ref 0–200)

## 2011-12-24 LAB — FOLATE: Folic Acid: 22.8 ng/mL (ref 3.1–100.0)

## 2011-12-29 ENCOUNTER — Emergency Department: Payer: Self-pay | Admitting: Emergency Medicine

## 2011-12-29 LAB — CBC
HGB: 13.8 g/dL (ref 12.0–16.0)
MCH: 30.5 pg (ref 26.0–34.0)
MCHC: 34 g/dL (ref 32.0–36.0)
MCV: 90 fL (ref 80–100)
WBC: 18.7 10*3/uL — ABNORMAL HIGH (ref 3.6–11.0)

## 2011-12-29 LAB — URINALYSIS, COMPLETE
Blood: NEGATIVE
Nitrite: NEGATIVE
Protein: NEGATIVE
Specific Gravity: 1.024 (ref 1.003–1.030)

## 2011-12-29 LAB — COMPREHENSIVE METABOLIC PANEL
Albumin: 3.9 g/dL (ref 3.4–5.0)
Alkaline Phosphatase: 99 U/L (ref 50–136)
BUN: 12 mg/dL (ref 7–18)
Bilirubin,Total: 0.4 mg/dL (ref 0.2–1.0)
Creatinine: 0.66 mg/dL (ref 0.60–1.30)
EGFR (African American): >60
EGFR (Non-African Amer.): >60
Glucose: 116 mg/dL — ABNORMAL HIGH (ref 65–99)
Osmolality: 280 (ref 275–301)
SGPT (ALT): 21 U/L
Sodium: 140 mmol/L (ref 136–145)
Total Protein: 7.9 g/dL (ref 6.4–8.2)

## 2011-12-29 LAB — AMYLASE: Amylase: 53 U/L (ref 25–115)

## 2012-03-08 ENCOUNTER — Inpatient Hospital Stay (HOSPITAL_COMMUNITY)
Admission: EM | Admit: 2012-03-08 | Discharge: 2012-03-11 | DRG: 918 | Disposition: A | Discharge status: Home / Self Care | Payer: Medicare Other | Attending: Internal Medicine | Admitting: Internal Medicine

## 2012-03-08 ENCOUNTER — Encounter (HOSPITAL_COMMUNITY): Payer: Self-pay | Admitting: Emergency Medicine

## 2012-03-08 DIAGNOSIS — D696 Thrombocytopenia, unspecified: Secondary | ICD-10-CM | POA: Diagnosis present

## 2012-03-08 DIAGNOSIS — T39094A Poisoning by salicylates, undetermined, initial encounter: Principal | ICD-10-CM | POA: Diagnosis present

## 2012-03-08 DIAGNOSIS — D821 Di George's syndrome: Secondary | ICD-10-CM | POA: Diagnosis present

## 2012-03-08 DIAGNOSIS — F319 Bipolar disorder, unspecified: Secondary | ICD-10-CM | POA: Diagnosis present

## 2012-03-08 DIAGNOSIS — F4329 Adjustment disorder with other symptoms: Secondary | ICD-10-CM | POA: Diagnosis present

## 2012-03-08 DIAGNOSIS — T39091A Poisoning by salicylates, accidental (unintentional), initial encounter: Secondary | ICD-10-CM | POA: Diagnosis present

## 2012-03-08 DIAGNOSIS — E876 Hypokalemia: Secondary | ICD-10-CM | POA: Diagnosis present

## 2012-03-08 DIAGNOSIS — T394X2A Poisoning by antirheumatics, not elsewhere classified, intentional self-harm, initial encounter: Secondary | ICD-10-CM | POA: Diagnosis present

## 2012-03-08 DIAGNOSIS — Y92009 Unspecified place in unspecified non-institutional (private) residence as the place of occurrence of the external cause: Secondary | ICD-10-CM

## 2012-03-08 DIAGNOSIS — F4321 Adjustment disorder with depressed mood: Secondary | ICD-10-CM | POA: Diagnosis present

## 2012-03-08 DIAGNOSIS — F29 Unspecified psychosis not due to a substance or known physiological condition: Secondary | ICD-10-CM | POA: Diagnosis present

## 2012-03-08 DIAGNOSIS — Z6841 Body Mass Index (BMI) 40.0 and over, adult: Secondary | ICD-10-CM

## 2012-03-08 DIAGNOSIS — E663 Overweight: Secondary | ICD-10-CM | POA: Diagnosis present

## 2012-03-08 DIAGNOSIS — F062 Psychotic disorder with delusions due to known physiological condition: Secondary | ICD-10-CM

## 2012-03-08 DIAGNOSIS — R4189 Other symptoms and signs involving cognitive functions and awareness: Secondary | ICD-10-CM | POA: Diagnosis present

## 2012-03-08 DIAGNOSIS — T39011A Poisoning by aspirin, accidental (unintentional), initial encounter: Secondary | ICD-10-CM

## 2012-03-08 HISTORY — DX: Bipolar disorder, unspecified: F31.9

## 2012-03-08 LAB — CBC
Hemoglobin: 14.1 g/dL (ref 12.0–15.0)
MCHC: 33.9 g/dL (ref 30.0–36.0)
Platelets: 140 10*3/uL — ABNORMAL LOW (ref 150–400)
RDW: 12.6 % (ref 11.5–15.5)

## 2012-03-08 LAB — DIFFERENTIAL
Basophils Absolute: 0 10*3/uL (ref 0.0–0.1)
Eosinophils Absolute: 0.8 10*3/uL — ABNORMAL HIGH (ref 0.0–0.7)
Lymphocytes Relative: 17 % (ref 12–46)
Monocytes Absolute: 1 10*3/uL (ref 0.1–1.0)
Neutrophils Relative %: 67 % (ref 43–77)

## 2012-03-08 LAB — COMPREHENSIVE METABOLIC PANEL
ALT: 14 U/L (ref 0–35)
AST: 15 U/L (ref 0–37)
Calcium: 9.4 mg/dL (ref 8.4–10.5)
Creatinine, Ser: 0.66 mg/dL (ref 0.50–1.10)
Sodium: 136 mEq/L (ref 135–145)
Total Protein: 8.2 g/dL (ref 6.0–8.3)

## 2012-03-08 LAB — URINALYSIS, ROUTINE W REFLEX MICROSCOPIC
Glucose, UA: NEGATIVE mg/dL
Hgb urine dipstick: NEGATIVE
Ketones, ur: NEGATIVE mg/dL
Protein, ur: NEGATIVE mg/dL
pH: 7.5 (ref 5.0–8.0)

## 2012-03-08 LAB — ETHANOL: Alcohol, Ethyl (B): <11 mg/dL (ref 0–11)

## 2012-03-08 LAB — URINE MICROSCOPIC-ADD ON

## 2012-03-08 LAB — RAPID URINE DRUG SCREEN, HOSP PERFORMED
Amphetamines: NOT DETECTED
Benzodiazepines: NOT DETECTED
Opiates: NOT DETECTED

## 2012-03-08 LAB — MAGNESIUM: Magnesium: 2.1 mg/dL (ref 1.5–2.5)

## 2012-03-08 LAB — SALICYLATE LEVEL: Salicylate Lvl: 40.8 mg/dL (ref 2.8–20.0)

## 2012-03-08 LAB — POCT PREGNANCY, URINE: Preg Test, Ur: NEGATIVE

## 2012-03-08 MED ORDER — ONDANSETRON HCL 4 MG/2ML IJ SOLN
4.0000 mg | INTRAMUSCULAR | Status: DC | PRN
  Administered 2012-03-08: 4 mg via INTRAVENOUS
  Filled 2012-03-08: qty 2

## 2012-03-08 MED ORDER — SODIUM CHLORIDE 0.9 % IV BOLUS (SEPSIS)
1000.0000 mL | Freq: Once | INTRAVENOUS | Status: AC
  Administered 2012-03-08: 1000 mL via INTRAVENOUS

## 2012-03-08 MED ORDER — SODIUM CHLORIDE 0.9 % IV SOLN
INTRAVENOUS | Status: DC
  Administered 2012-03-09: 03:00:00 via INTRAVENOUS

## 2012-03-08 MED ORDER — DEXTROSE 50 % IV SOLN
1.0000 | Freq: Once | INTRAVENOUS | Status: AC
  Administered 2012-03-08: 50 mL via INTRAVENOUS
  Filled 2012-03-08: qty 50

## 2012-03-08 MED ORDER — CHARCOAL ACTIVATED PO LIQD
50.0000 g | Freq: Once | ORAL | Status: AC
  Administered 2012-03-08: 50 g via ORAL
  Filled 2012-03-08: qty 240

## 2012-03-08 MED ORDER — SODIUM BICARBONATE 8.4 % IV SOLN
INTRAVENOUS | Status: DC
  Administered 2012-03-08 – 2012-03-09 (×2): via INTRAVENOUS
  Filled 2012-03-08 (×8): qty 150

## 2012-03-08 NOTE — ED Notes (Signed)
[id number]-------Tara Montalvo, pt's guardian

## 2012-03-08 NOTE — ED Notes (Signed)
Per EMS-pt took handful of aspirin so she could go see her Aunt who is dead-made herself vomit X4 to get pills up

## 2012-03-08 NOTE — ED Notes (Signed)
ZOX:[id number]<AV> Expected date:<BR> Expected time: 6:47 PM<BR> Means of arrival:Ambulance<BR> Comments:<BR> M130 -- ASA Overdose

## 2012-03-08 NOTE — ED Provider Notes (Signed)
History     CSN: [id number]  Arrival date & time 03/08/12  1843   First MD Initiated Contact with Patient 03/08/12 1902      Chief Complaint  Patient presents with  . Drug Overdose    (Consider location/radiation/quality/duration/timing/severity/associated sxs/prior treatment) HPI  Patient lives in a group home. She has been home visiting her mother because of some roommate problems which mother states has been resolved. She relates they have moved the patient's room so she no longer will be around the person that she was upset about. She relates patient cannot be left alone that she will try to harm herself. She/or how it happened but at about 3:45 PM today patient took a handful of aspirin but she made herself throw up about 4 times. Her mother states she did see some pill particles in the vomitus. Patient states she took the pills out of frustration stating "I wanted to end it all ". She states now she is heartbroken in which she hadn't done what she did. Her mother reports she has started intensive therapy recently and she is just now going through the process of grieving for 3 family members who died within 24 hours of each other about 3 years ago. At which point both patient and her mother start crying. Patient was just admitted to the hospital 3 months ago for medication adjustment. She also recently had her medications changed about 3 or 4 weeks ago. Mother patient is interested in getting patient medically cleared and then hopefully take her back to her group home. She states patient is safe at her group home because medications are locked up.    [facility]  Past Medical History  Diagnosis Date  . 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 No pertinent past surgical history. Tetralogy of Fallot repair at 49 months of age  No family history on file.  History  Substance Use Topics  . Smoking  status: yes  . Smokeless tobacco: Not on file  . Alcohol Use: No  lives in group home  OB History    Grav Para Term Preterm Abortions TAB SAB Ect Mult Living                  Review of Systems  All other systems reviewed and are negative.    Allergies  Review of patient's allergies indicates no known allergies.  Home Medications   Current Outpatient Rx  Name Route Sig Dispense Refill  . FLUOXETINE HCL 20 MG PO CAPS Oral Take 60 mg by mouth daily.    Marland Kitchen HALOPERIDOL 1 MG PO TABS Oral Take 1 mg by mouth 2 (two) times daily.    . ADULT MULTIVITAMIN W/MINERALS CH Oral Take 1 tablet by mouth daily.    . QUETIAPINE FUMARATE 200 MG PO TABS Oral Take 200 mg by mouth at bedtime.      BP 125/63  Pulse 78  Temp 98 F (36.7 C)  Resp 20  SpO2 99%  Vital signs normal    Physical Exam  Nursing note and vitals reviewed. Constitutional: She is oriented to person, place, and time. She appears well-developed and well-nourished.  Non-toxic appearance. She does not appear ill. No distress.  HENT:  Head: Normocephalic and atraumatic.  Right Ear: External ear normal.  Left Ear: External ear normal.  Nose: Nose normal. No mucosal edema or rhinorrhea.  Mouth/Throat: Oropharynx is clear and  moist and mucous membranes are normal. No dental abscesses or uvula swelling.  Eyes: Conjunctivae and EOM are normal. Pupils are equal, round, and reactive to light.  Neck: Normal range of motion and full passive range of motion without pain. Neck supple.  Cardiovascular: Normal rate, regular rhythm and normal heart sounds.  Exam reveals no gallop and no friction rub.   No murmur heard. Pulmonary/Chest: Effort normal and breath sounds normal. No respiratory distress. She has no wheezes. She has no rhonchi. She has no rales. She exhibits no tenderness and no crepitus.  Abdominal: Soft. Normal appearance and bowel sounds are normal. She exhibits no distension. There is no tenderness. There is no rebound and  no guarding.  Musculoskeletal: Normal range of motion. She exhibits no edema and no tenderness.       Moves all extremities well.   Neurological: She is alert and oriented to person, place, and time. She has normal strength. No cranial nerve deficit.  Skin: Skin is warm, dry and intact. No rash noted. No erythema. No pallor.  Psychiatric: She has a normal mood and affect. Her speech is normal and behavior is normal. Her mood appears not anxious.       Cries at times during the interview    ED Course  Procedures (including critical care time)   Medications  0.9 %  sodium chloride infusion (not administered)  sodium bicarbonate 150 mEq in dextrose 5 % 1,000 mL infusion (  Intravenous New Bag/Given 03/08/12 2316)  ondansetron (ZOFRAN) injection 4 mg (4 mg Intravenous Given 03/08/12 2256)  sodium chloride 0.9 % bolus 1,000 mL (1000 mL Intravenous Given 03/08/12 2237)  dextrose 50 % solution 50 mL (50 mL Intravenous Given 03/08/12 2304)  charcoal activated (NO SORBITOL) (ACTIDOSE-AQUA) suspension 50 g (50 g Oral Given 03/08/12 2258)    22:11 Poison Control, give 1 amp D50, start on bicarb drip and monitor U.O.   MOP and patient advised she will need to be admitted medically.   23:10 Dr Norville Haggard states can be admitted by hospitalist and if needs ICU they can follow  Dr Phillips Odor, admit to tele, team 1    Results for orders placed during the hospital encounter of 03/08/12  CBC      Component Value Range   WBC 11.0 (*) 4.0 - 10.5 (K/uL)   RBC 4.73  3.87 - 5.11 (MIL/uL)   Hemoglobin 14.1  12.0 - 15.0 (g/dL)   HCT 16.1  09.6 - 04.5 (%)   MCV 87.9  78.0 - 100.0 (fL)   MCH 29.8  26.0 - 34.0 (pg)   MCHC 33.9  30.0 - 36.0 (g/dL)   RDW 40.9  81.1 - 91.4 (%)   Platelets 140 (*) 150 - 400 (K/uL)  DIFFERENTIAL      Component Value Range   Neutrophils Relative 67  43 - 77 (%)   Lymphocytes Relative 17  12 - 46 (%)   Monocytes Relative 9  3 - 12 (%)   Eosinophils Relative 7 (*) 0 - 5 (%)    Basophils Relative 0  0 - 1 (%)   Neutro Abs 7.3  1.7 - 7.7 (K/uL)   Lymphs Abs 1.9  0.7 - 4.0 (K/uL)   Monocytes Absolute 1.0  0.1 - 1.0 (K/uL)   Eosinophils Absolute 0.8 (*) 0.0 - 0.7 (K/uL)   Basophils Absolute 0.0  0.0 - 0.1 (K/uL)   WBC Morphology MILD LEFT SHIFT (1-5% METAS, OCC MYELO, OCC BANDS)     Smear  Review LARGE PLATELETS PRESENT    COMPREHENSIVE METABOLIC PANEL      Component Value Range   Sodium 136  135 - 145 (mEq/L)   Potassium 3.6  3.5 - 5.1 (mEq/L)   Chloride 102  96 - 112 (mEq/L)   CO2 20  19 - 32 (mEq/L)   Glucose, Bld 88  70 - 99 (mg/dL)   BUN 8  6 - 23 (mg/dL)   Creatinine, Ser 1.61  0.50 - 1.10 (mg/dL)   Calcium 9.4  8.4 - 09.6 (mg/dL)   Total Protein 8.2  6.0 - 8.3 (g/dL)   Albumin 3.9  3.5 - 5.2 (g/dL)   AST 15  0 - 37 (U/L)   ALT 14  0 - 35 (U/L)   Alkaline Phosphatase 119 (*) 39 - 117 (U/L)   Total Bilirubin 0.1 (*) 0.3 - 1.2 (mg/dL)   GFR calc non Af Amer >90  >90 (mL/min)   GFR calc Af Amer >90  >90 (mL/min)  ACETAMINOPHEN LEVEL      Component Value Range   Acetaminophen (Tylenol), Serum <15.0  10 - 30 (ug/mL)  SALICYLATE LEVEL      Component Value Range   Salicylate Lvl 40.8 (*) 2.8 - 20.0 (mg/dL)  ETHANOL      Component Value Range   Alcohol, Ethyl (B) <11  0 - 11 (mg/dL)  URINE RAPID DRUG SCREEN (HOSP PERFORMED)      Component Value Range   Opiates NONE DETECTED  NONE DETECTED    Cocaine NONE DETECTED  NONE DETECTED    Benzodiazepines NONE DETECTED  NONE DETECTED    Amphetamines NONE DETECTED  NONE DETECTED    Tetrahydrocannabinol NONE DETECTED  NONE DETECTED    Barbiturates NONE DETECTED  NONE DETECTED   URINALYSIS, ROUTINE W REFLEX MICROSCOPIC      Component Value Range   Color, Urine YELLOW  YELLOW    APPearance CLOUDY (*) CLEAR    Specific Gravity, Urine 1.007  1.005 - 1.030    pH 7.5  5.0 - 8.0    Glucose, UA NEGATIVE  NEGATIVE (mg/dL)   Hgb urine dipstick NEGATIVE  NEGATIVE    Bilirubin Urine NEGATIVE  NEGATIVE    Ketones, ur  NEGATIVE  NEGATIVE (mg/dL)   Protein, ur NEGATIVE  NEGATIVE (mg/dL)   Urobilinogen, UA 0.2  0.0 - 1.0 (mg/dL)   Nitrite NEGATIVE  NEGATIVE    Leukocytes, UA TRACE (*) NEGATIVE   POCT PREGNANCY, URINE      Component Value Range   Preg Test, Ur NEGATIVE  NEGATIVE   URINE MICROSCOPIC-ADD ON      Component Value Range   Squamous Epithelial / LPF FEW (*) RARE    WBC, UA 3-6  <3 (WBC/hpf)   Bacteria, UA FEW (*) RARE    Urine-Other MUCOUS PRESENT    MAGNESIUM      Component Value Range   Magnesium 2.1  1.5 - 2.5 (mg/dL)   Laboratory interpretation all normal except elevated aspirin    Date: 03/08/2012  Rate: 89  Rhythm: normal sinus rhythm  QRS Axis: normal  Intervals: normal  ST/T Wave abnormalities: nonspecific ST/T changes  Conduction Disutrbances:IRBBB  Narrative Interpretation:   Old EKG Reviewed: none available    1. Aspirin overdose    Plan admission  Devoria Albe, MD, FACEP   CRITICAL CARE Performed by: Devoria Albe L   Total critical care time:33 min  Critical care time was exclusive of separately billable procedures and treating other patients.  Critical  care was necessary to treat or prevent imminent or life-threatening deterioration.  Critical care was time spent personally by me on the following activities: development of treatment plan with patient and/or surrogate as well as nursing, discussions with consultants, evaluation of patient's response to treatment, examination of patient, obtaining history from patient or surrogate, ordering and performing treatments and interventions, ordering and review of laboratory studies, ordering and review of radiographic studies, pulse oximetry and re-evaluation of patient's condition.   MDM          Ward Givens, MD 03/08/12 [phone]

## 2012-03-09 ENCOUNTER — Emergency Department (HOSPITAL_COMMUNITY): Payer: Medicare Other

## 2012-03-09 DIAGNOSIS — F319 Bipolar disorder, unspecified: Secondary | ICD-10-CM

## 2012-03-09 DIAGNOSIS — D821 Di George's syndrome: Secondary | ICD-10-CM | POA: Diagnosis present

## 2012-03-09 DIAGNOSIS — F29 Unspecified psychosis not due to a substance or known physiological condition: Secondary | ICD-10-CM | POA: Diagnosis present

## 2012-03-09 DIAGNOSIS — R4189 Other symptoms and signs involving cognitive functions and awareness: Secondary | ICD-10-CM | POA: Diagnosis present

## 2012-03-09 DIAGNOSIS — T39094A Poisoning by salicylates, undetermined, initial encounter: Principal | ICD-10-CM

## 2012-03-09 DIAGNOSIS — T394X2A Poisoning by antirheumatics, not elsewhere classified, intentional self-harm, initial encounter: Secondary | ICD-10-CM

## 2012-03-09 LAB — BLOOD GAS, VENOUS
Acid-Base Excess: 1.5 mmol/L (ref 0.0–2.0)
Bicarbonate: 23.3 mEq/L (ref 20.0–24.0)
FIO2: 0.21 %
O2 Saturation: 91.6 %
Patient temperature: 98.6
TCO2: 20.4 mmol/L (ref 0–100)
pCO2, Ven: 29.1 mmHg — ABNORMAL LOW (ref 45.0–50.0)
pH, Ven: 7.514 — ABNORMAL HIGH (ref 7.250–7.300)
pO2, Ven: 56.9 mmHg — ABNORMAL HIGH (ref 30.0–45.0)

## 2012-03-09 LAB — GLUCOSE, CAPILLARY
Glucose-Capillary: 101 mg/dL — ABNORMAL HIGH (ref 70–99)
Glucose-Capillary: 96 mg/dL (ref 70–99)
Glucose-Capillary: 109 mg/dL — ABNORMAL HIGH (ref 70–99)

## 2012-03-09 LAB — BASIC METABOLIC PANEL
CO2: 21 mEq/L (ref 19–32)
CO2: 24 mEq/L (ref 19–32)
Chloride: 103 mEq/L (ref 96–112)
Chloride: 103 mEq/L (ref 96–112)
Creatinine, Ser: 0.68 mg/dL (ref 0.50–1.10)
Glucose, Bld: 99 mg/dL (ref 70–99)
Potassium: 3.2 mEq/L — ABNORMAL LOW (ref 3.5–5.1)
Sodium: 139 mEq/L (ref 135–145)
Sodium: 138 mEq/L (ref 135–145)

## 2012-03-09 LAB — SALICYLATE LEVEL
Salicylate Lvl: 27.9 mg/dL — ABNORMAL HIGH (ref 2.8–20.0)
Salicylate Lvl: <2 mg/dL — ABNORMAL LOW (ref 2.8–20.0)

## 2012-03-09 MED ORDER — LORAZEPAM 2 MG/ML IJ SOLN
1.0000 mg | INTRAMUSCULAR | Status: DC | PRN
  Administered 2012-03-09 – 2012-03-11 (×4): 1 mg via INTRAVENOUS
  Filled 2012-03-09 (×4): qty 1

## 2012-03-09 MED ORDER — QUETIAPINE FUMARATE 50 MG PO TABS
250.0000 mg | ORAL_TABLET | Freq: Every day | ORAL | Status: DC
  Administered 2012-03-09 – 2012-03-10 (×2): 250 mg via ORAL
  Filled 2012-03-09 (×4): qty 1

## 2012-03-09 MED ORDER — ONDANSETRON HCL 4 MG PO TABS: 4.0000 mg | ORAL_TABLET | Freq: Four times a day (QID) | ORAL | Status: DC | PRN

## 2012-03-09 MED ORDER — QUETIAPINE FUMARATE 200 MG PO TABS
200.0000 mg | ORAL_TABLET | Freq: Every day | ORAL | Status: DC
  Filled 2012-03-09 (×2): qty 1

## 2012-03-09 MED ORDER — HALOPERIDOL 1 MG PO TABS
1.0000 mg | ORAL_TABLET | Freq: Two times a day (BID) | ORAL | Status: DC
  Administered 2012-03-09 (×2): 1 mg via ORAL
  Filled 2012-03-09 (×5): qty 1

## 2012-03-09 MED ORDER — FLUOXETINE HCL 20 MG PO CAPS
60.0000 mg | ORAL_CAPSULE | Freq: Every day | ORAL | Status: DC
  Administered 2012-03-09 – 2012-03-11 (×3): 60 mg via ORAL
  Filled 2012-03-09 (×4): qty 3

## 2012-03-09 MED ORDER — ADULT MULTIVITAMIN W/MINERALS CH
1.0000 | ORAL_TABLET | Freq: Every day | ORAL | Status: DC
  Administered 2012-03-09 – 2012-03-11 (×3): 1 via ORAL
  Filled 2012-03-09 (×4): qty 1

## 2012-03-09 MED ORDER — POTASSIUM CHLORIDE CRYS ER 20 MEQ PO TBCR
40.0000 meq | EXTENDED_RELEASE_TABLET | Freq: Once | ORAL | Status: AC
  Administered 2012-03-09: 40 meq via ORAL
  Filled 2012-03-09 (×2): qty 2

## 2012-03-09 MED ORDER — ONDANSETRON HCL 4 MG/2ML IJ SOLN: 4.0000 mg | Freq: Four times a day (QID) | INTRAMUSCULAR | Status: DC | PRN

## 2012-03-09 NOTE — Progress Notes (Signed)
CARE MANAGEMENT NOTE 03/09/2012  Patient:  VADIE, PRINCIPATO   Account Number:  [id number]  Date Initiated:  03/09/2012  Documentation initiated by:  Naliya Gish  Subjective/Objective Assessment:   pt with over dose of ASA, hx of bi-polar disorder and PTSS,     Action/Plan:   lives at home   Anticipated DC Date:  03/12/2012   Anticipated DC Plan:  HOME/SELF CARE  In-house referral  NA      DC Planning Services  NA  NA      PAC Choice  NA   Choice offered to / List presented to:  NA   DME arranged  NA      DME agency  NA     HH arranged  NA      HH agency  NA   Status of service:  In process, will continue to follow Medicare Important Message given?  YES (If response is "NO", the following Medicare IM given date fields will be blank) Date Medicare IM given:  03/08/2012 Date Additional Medicare IM given:    Discharge Disposition:    Per UR Regulation:  Reviewed for med. necessity/level of care/duration of stay  If discussed at Long Length of Stay Meetings, dates discussed:    Comments:  [id number]/Jonette Wassel Lorrin Mais Case Management [phone]

## 2012-03-09 NOTE — Progress Notes (Signed)
CSW and psych MD met with Pt's mom prior to meeting with Pt.  Per Pt's mom, Pt is MR, with an IQ of 75 and has attempted suicide 17+ times.  Pt's mom states that Pt currently resides at [facility] group home in [place] and has been there for 6-7 months.  Prior to this placement, Pt has been in 2 other group homes.  Pt and Pt's mom happy with Pt's current placement, save for a new resident that picks on Pt, causing Pt significant distress.  Pt's mom states that she allowed Pt to return home for several days while the group home handled the situation with the new resident.  While at home, Pt bit herself and OD'd on a large quantity of ASA.  Per mom, all medications are locked up and it is unknown how Pt obtained the ASA.  Additionally, Pt was found watching [place]mare on [address] and it's unknown how she obtained this movie, as horror movies serve to upset and agitate Pt, thus she's not supposed to watch these.  Pt receives outpt therapy 3x per week, as well as psychiatric care, at an outpt tx facility in [place].  Pt's mom was upset and exhausted and CSW suggested that she return home to get some sleep.  CSW and psych MD discussed the current situation with Pt.  Pt stated that she overdosed due to being in emotional pain.  Pt explained that her mom doesn't understand what she's going through and her living situation is upsetting to her.  Additionally, Pt states that she is mourning the loss of her aunt, who died 3 years ago.   Pt reports that she is currently engaged to "Gerilyn Pilgrim" and that he brings her great joy.  She states that they met at a dance and brightens when discussing him.  Pt states that she had attempted suicide "way more than 17x" and that she's received inpt tx at [facility] numerous times, with the last stint within the past 6 years.  Pt agreeable to returning to [facility].  Pt denies HI.  Pt's drug screen was negative.  Pt reports seeing "scarey strangers" but was unable  to elaborate further.  CSW and psych MD thanked Pt for her time.  CSW to continue to follow.  Providence Crosby, LCSWA Clinical Social Work [phone]

## 2012-03-09 NOTE — Progress Notes (Signed)
Message from Pt's mom stating that she will be working tomorrow and will not be able to meet with CSW and psych MD in person but that she will be available via phone.  CSW to continue to follow.  Providence Crosby, LCSWA Clinical Social Work [phone]

## 2012-03-09 NOTE — H&P (Signed)
Hospital Admission Note Date: 03/09/2012  Patient name: Judy Burns Medical record number: [id number] Date of birth: [birth year] Age: 21 y.o. Gender: female PCP: No primary provider on file.  Medical Service: [facility] Team 1  Attending physician: A. Elisabeth Pigeon     Chief Complaint: ASA overdose   History of Present Illness: This is an unfortunate 21 year old woman with severe bipolar disease, PTSD, multiple suicide attempts, psychosis/delusional disorder and a congential cognitive/cardiac abnormality (DiGeorge Syndrome) who comes in after taking a "handful" of regular strength, non-enteric 325mg  ASA while staying at her mother's house. She was only visiting her mother while roommate issues were being worked out at her group home. She lives in a group home where she has constant supervision and medication management. Per her mother the ASA was taken around 6PM, after taking the pills the patient self induced vomiting X4, felt remorse and then said she wanted to "end it all". [facility] called to admit for ASA toxicity OD management. Poison controlled called in ED, activated charcoal given X1, D50 given, and bicarb gtt started.      Meds: Medications Prior to Admission  Medication Dose Route Frequency Provider Last Rate Last Dose  . 0.9 %  sodium chloride infusion   Intravenous Continuous Ward Givens, MD      . charcoal activated (NO SORBITOL) (ACTIDOSE-AQUA) suspension 50 g  50 g Oral Once Ward Givens, MD   50 g at 03/08/12 2258  . dextrose 50 % solution 50 mL  1 ampule Intravenous Once Ward Givens, MD   50 mL at 03/08/12 2304  . ondansetron (ZOFRAN) injection 4 mg  4 mg Intravenous Q20 Min PRN Ward Givens, MD   4 mg at 03/08/12 2256  . sodium bicarbonate 150 mEq in dextrose 5 % 1,000 mL infusion   Intravenous Continuous Ward Givens, MD 250 mL/hr at 03/08/12 2316    . sodium chloride 0.9 % bolus 1,000 mL  1,000 mL Intravenous Once Ward Givens, MD   1,000 mL at 03/08/12 2237   No current outpatient  prescriptions on file as of 03/08/2012.    Allergies: Allergies as of 03/08/2012  . (No Known Allergies)   Past Medical History  Diagnosis Date  . Bipolar 1 disorder    History reviewed. No pertinent past surgical history. No family history on file. History   Social History  . Marital Status: Single    Spouse Name: N/A    Number of Children: N/A  . Years of Education: N/A   Occupational History  . Not on file.   Social History Main Topics  . Smoking status: Not on file  . Smokeless tobacco: Not on file  . Alcohol Use: No  . Drug Use: No  . Sexually Active:    Other Topics Concern  . Not on file   Social History Narrative  . No narrative on file    Review of Systems: Pertinent items are noted in HPI.  Physical Exam: Blood pressure 125/63, pulse 78, temperature 98 F (36.7 C), resp. rate 20, SpO2 99.00%.. General appearance: Appears hot, ruddy complexion, NAD, conversant  Eyes: anicteric sclerae, moist conjunctivae; no lid-lag; PERRLA HENT: Atraumatic; oropharynx clear with moist mucous membranes and no mucosal ulcerations; normal hard and soft palate Neck: Trachea midline; FROM, supple, no thyromegaly or lymphadenopathy Lungs: CTA, with normal respiratory effort and no intercostal retractions CV: RRR, no MRGs  Abdomen: Soft, non-tender; no masses or HSM Extremities: No peripheral edema or extremity lymphadenopathy Skin:  Normalturgor and texture; no rash, ulcers or subcutaneous nodules Psych: flat affect, poor insight and judgement, alert and oriented to person, place and time, denies Auditory or visual hallucinations, no active SI or HI.  Lab results: Basic Metabolic Panel:  [facility] 03/08/12 1935  NA 136  K 3.6  CL 102  CO2 20  GLUCOSE 88  BUN 8  CREATININE 0.66  CALCIUM 9.4  MG 2.1  PHOS --   Liver Function Tests:  Basename 03/08/12 1935  AST 15  ALT 14  ALKPHOS 119*  BILITOT 0.1*  PROT 8.2  ALBUMIN 3.9   No results found for this  basename: LIPASE:2,AMYLASE:2 in the last 72 hours No results found for this basename: AMMONIA:2 in the last 72 hours CBC:  Basename 03/08/12 1935  WBC 11.0*  NEUTROABS 7.3  HGB 14.1  HCT 41.6  MCV 87.9  PLT 140*   Urine Drug Screen: Drugs of Abuse     Component Value Date/Time   LABOPIA NONE DETECTED 03/08/2012 1947   COCAINSCRNUR NONE DETECTED 03/08/2012 1947   LABBENZ NONE DETECTED 03/08/2012 1947   AMPHETMU NONE DETECTED 03/08/2012 1947   THCU NONE DETECTED 03/08/2012 1947   LABBARB NONE DETECTED 03/08/2012 1947    Alcohol Level:  Basename 03/08/12 1935  ETH <11   Urinalysis:  Basename 03/08/12 1947  COLORURINE YELLOW  LABSPEC 1.007  PHURINE 7.5  GLUCOSEU NEGATIVE  HGBUR NEGATIVE  BILIRUBINUR NEGATIVE  KETONESUR NEGATIVE  PROTEINUR NEGATIVE  UROBILINOGEN 0.2  NITRITE NEGATIVE  LEUKOCYTESUR TRACE*    Imaging results:  No results found.  Other results: EKG: NSR  Assessment & Plan by Problem: Principal Problem:  *Salicylate overdose Active Problems:  Bipolar 1 disorder  DiGeorge syndrome  Psychosis  Cognitive impairment  1. Aspirin Overdose, Intentional Suicide attempt Admitted to stepdown for close observation since her ASA level was >40 Respiratory status is currently stable Supplemental O2 BiCarb gtt per pharmacy for urine alkalization Glucose repletion amp of D50 given Will check Salicylate level q2 untuil declining Check ABG to monitor for acidosis Monitor BMET q4 for renal function Strict I/O Seizure precautions SI precatuions, call psych in AM for eval/clearance. Plan is for her to go back to group home when medically stable.  2. Other problems are chronic psych issues, I continued current psych meds.  Dispo; Back to group home when medically stable.  SignedAnderson Malta 03/09/2012, 12:24 AM

## 2012-03-09 NOTE — Consult Note (Addendum)
Reason for Consult:Salycylate Overdose, Suicide Attempt Referring Physician: Dr.Hongalgi  Judy Burns is an 21 y.o. female.  HPI: 21 year old woman with severe bipolar disease, PTSD, multiple suicide attempts, psychosis/delusional disorder and a genetic velocardiofacial syndrome,  (DiGeorge Syndrome) who comes in after taking a "handful" of regular strength, non-enteric 325mg  ASA while staying at her mother's house. She was only visiting her mother while roommate issues were being worked out at her group home. She lives in a group home where she has constant supervision and medication management. Per her mother the ASA was taken around 6PM, after taking the pills the patient self induced vomiting X4, felt remorse and then said she wanted to "end it all". [facility] called to admit for ASA toxicity OD management.   AXIS I Bipolar I Disorder, Depression, with suicidal ideation PTSD AXIS II Mental Retardation <70 AXIS III Past Medical History  Diagnosis Date  . Bipolar 1 disorder     History reviewed. No pertinent past surgical history. AXIS IV Supportive family; mother with guardianship; poor impulse control, unresolved bereavement AXIS V  GAF  35   No family history on file.  Social History:  does not have a smoking history on file. She does not have any smokeless tobacco history on file. She reports that she does not drink alcohol or use illicit drugs.  Allergies: No Known Allergies  Medications: I have reviewed the patient's current medications.  Results for orders placed during the hospital encounter of 03/08/12 (from the past 48 hour(s))  CBC     Status: Abnormal   Collection Time   03/08/12  7:35 PM      Component Value Range Comment   WBC 11.0 (*) 4.0 - 10.5 (K/uL)    RBC 4.73  3.87 - 5.11 (MIL/uL)    Hemoglobin 14.1  12.0 - 15.0 (g/dL)    HCT 16.1  09.6 - 04.5 (%)    MCV 87.9  78.0 - 100.0 (fL)    MCH 29.8  26.0 - 34.0 (pg)    MCHC 33.9  30.0 - 36.0 (g/dL)    RDW 40.9   81.1 - 91.4 (%)    Platelets 140 (*) 150 - 400 (K/uL)   DIFFERENTIAL     Status: Abnormal   Collection Time   03/08/12  7:35 PM      Component Value Range Comment   Neutrophils Relative 67  43 - 77 (%)    Lymphocytes Relative 17  12 - 46 (%)    Monocytes Relative 9  3 - 12 (%)    Eosinophils Relative 7 (*) 0 - 5 (%)    Basophils Relative 0  0 - 1 (%)    Neutro Abs 7.3  1.7 - 7.7 (K/uL)    Lymphs Abs 1.9  0.7 - 4.0 (K/uL)    Monocytes Absolute 1.0  0.1 - 1.0 (K/uL)    Eosinophils Absolute 0.8 (*) 0.0 - 0.7 (K/uL)    Basophils Absolute 0.0  0.0 - 0.1 (K/uL)    WBC Morphology MILD LEFT SHIFT (1-5% METAS, OCC MYELO, OCC BANDS)      Smear Review LARGE PLATELETS PRESENT   PLATELET CLUMPS NOTED ON SMEAR  COMPREHENSIVE METABOLIC PANEL     Status: Abnormal   Collection Time   03/08/12  7:35 PM      Component Value Range Comment   Sodium 136  135 - 145 (mEq/L)    Potassium 3.6  3.5 - 5.1 (mEq/L)    Chloride 102  96 -  112 (mEq/L)    CO2 20  19 - 32 (mEq/L)    Glucose, Bld 88  70 - 99 (mg/dL)    BUN 8  6 - 23 (mg/dL)    Creatinine, Ser 1.61  0.50 - 1.10 (mg/dL)    Calcium 9.4  8.4 - 10.5 (mg/dL)    Total Protein 8.2  6.0 - 8.3 (g/dL)    Albumin 3.9  3.5 - 5.2 (g/dL)    AST 15  0 - 37 (U/L)    ALT 14  0 - 35 (U/L)    Alkaline Phosphatase 119 (*) 39 - 117 (U/L)    Total Bilirubin 0.1 (*) 0.3 - 1.2 (mg/dL)    GFR calc non Af Amer >90  >90 (mL/min)    GFR calc Af Amer >90  >90 (mL/min)   ACETAMINOPHEN LEVEL     Status: Normal   Collection Time   03/08/12  7:35 PM      Component Value Range Comment   Acetaminophen (Tylenol), Serum <15.0  10 - 30 (ug/mL)   SALICYLATE LEVEL     Status: Abnormal   Collection Time   03/08/12  7:35 PM      Component Value Range Comment   Salicylate Lvl 40.8 (*) 2.8 - 20.0 (mg/dL)   ETHANOL     Status: Normal   Collection Time   03/08/12  7:35 PM      Component Value Range Comment   Alcohol, Ethyl (B) <11  0 - 11 (mg/dL)   MAGNESIUM     Status: Normal    Collection Time   03/08/12  7:35 PM      Component Value Range Comment   Magnesium 2.1  1.5 - 2.5 (mg/dL)   URINE RAPID DRUG SCREEN (HOSP PERFORMED)     Status: Normal   Collection Time   03/08/12  7:47 PM      Component Value Range Comment   Opiates NONE DETECTED  NONE DETECTED     Cocaine NONE DETECTED  NONE DETECTED     Benzodiazepines NONE DETECTED  NONE DETECTED     Amphetamines NONE DETECTED  NONE DETECTED     Tetrahydrocannabinol NONE DETECTED  NONE DETECTED     Barbiturates NONE DETECTED  NONE DETECTED    URINALYSIS, ROUTINE W REFLEX MICROSCOPIC     Status: Abnormal   Collection Time   03/08/12  7:47 PM      Component Value Range Comment   Color, Urine YELLOW  YELLOW     APPearance CLOUDY (*) CLEAR     Specific Gravity, Urine 1.007  1.005 - 1.030     pH 7.5  5.0 - 8.0     Glucose, UA NEGATIVE  NEGATIVE (mg/dL)    Hgb urine dipstick NEGATIVE  NEGATIVE     Bilirubin Urine NEGATIVE  NEGATIVE     Ketones, ur NEGATIVE  NEGATIVE (mg/dL)    Protein, ur NEGATIVE  NEGATIVE (mg/dL)    Urobilinogen, UA 0.2  0.0 - 1.0 (mg/dL)    Nitrite NEGATIVE  NEGATIVE     Leukocytes, UA TRACE (*) NEGATIVE    URINE MICROSCOPIC-ADD ON     Status: Abnormal   Collection Time   03/08/12  7:47 PM      Component Value Range Comment   Squamous Epithelial / LPF FEW (*) RARE     WBC, UA 3-6  <3 (WBC/hpf)    Bacteria, UA FEW (*) RARE     Urine-Other MUCOUS PRESENT  POCT PREGNANCY, URINE     Status: Normal   Collection Time   03/08/12  7:53 PM      Component Value Range Comment   Preg Test, Ur NEGATIVE  NEGATIVE    SALICYLATE LEVEL     Status: Abnormal   Collection Time   03/09/12  1:45 AM      Component Value Range Comment   Salicylate Lvl 27.9 (*) 2.8 - 20.0 (mg/dL)   BASIC METABOLIC PANEL     Status: Abnormal   Collection Time   03/09/12  1:45 AM      Component Value Range Comment   Sodium 139  135 - 145 (mEq/L)    Potassium 3.1 (*) 3.5 - 5.1 (mEq/L)    Chloride 103  96 - 112 (mEq/L)    CO2  21  19 - 32 (mEq/L)    Glucose, Bld 91  70 - 99 (mg/dL)    BUN 8  6 - 23 (mg/dL)    Creatinine, Ser 1.61  0.50 - 1.10 (mg/dL)    Calcium 8.7  8.4 - 10.5 (mg/dL)    GFR calc non Af Amer >90  >90 (mL/min)    GFR calc Af Amer >90  >90 (mL/min)   GLUCOSE, CAPILLARY     Status: Abnormal   Collection Time   03/09/12  1:45 AM      Component Value Range Comment   Glucose-Capillary 103 (*) 70 - 99 (mg/dL)    Comment 1 Notify RN      Comment 2 Documented in Chart     SALICYLATE LEVEL     Status: Abnormal   Collection Time   03/09/12  3:14 AM      Component Value Range Comment   Salicylate Lvl 25.4 (*) 2.8 - 20.0 (mg/dL)   BLOOD GAS, VENOUS     Status: Abnormal   Collection Time   03/09/12  3:34 AM      Component Value Range Comment   FIO2 0.21      pH, Ven 7.514 (*) 7.250 - 7.300     pCO2, Ven 29.1 (*) 45.0 - 50.0 (mmHg)    pO2, Ven 56.9 (*) 30.0 - 45.0 (mmHg)    Bicarbonate 23.3  20.0 - 24.0 (mEq/L)    TCO2 20.4  0 - 100 (mmol/L)    Acid-Base Excess 1.5  0.0 - 2.0 (mmol/L)    O2 Saturation 91.6      Patient temperature 98.6      Collection site VEIN      Drawn by COLLECTED BY NURSE      Sample type VENOUS     MRSA PCR SCREENING     Status: Normal   Collection Time   03/09/12  4:28 AM      Component Value Range Comment   MRSA by PCR NEGATIVE  NEGATIVE    SALICYLATE LEVEL     Status: Normal   Collection Time   03/09/12  5:37 AM      Component Value Range Comment   Salicylate Lvl 16.9  2.8 - 20.0 (mg/dL)   BASIC METABOLIC PANEL     Status: Abnormal   Collection Time   03/09/12  5:37 AM      Component Value Range Comment   Sodium 138  135 - 145 (mEq/L)    Potassium 3.2 (*) 3.5 - 5.1 (mEq/L)    Chloride 103  96 - 112 (mEq/L)    CO2 24  19 - 32 (mEq/L)    Glucose,  Bld 99  70 - 99 (mg/dL)    BUN 11  6 - 23 (mg/dL)    Creatinine, Ser 5.18  0.50 - 1.10 (mg/dL)    Calcium 8.5  8.4 - 10.5 (mg/dL)    GFR calc non Af Amer >90  >90 (mL/min)    GFR calc Af Amer >90  >90 (mL/min)     GLUCOSE, CAPILLARY     Status: Abnormal   Collection Time   03/09/12  6:14 AM      Component Value Range Comment   Glucose-Capillary 101 (*) 70 - 99 (mg/dL)   GLUCOSE, CAPILLARY     Status: Normal   Collection Time   03/09/12  7:32 AM      Component Value Range Comment   Glucose-Capillary 96  70 - 99 (mg/dL)    Comment 1 Documented in Chart      Comment 2 Notify RN     SALICYLATE LEVEL     Status: Normal   Collection Time   03/09/12  7:45 AM      Component Value Range Comment   Salicylate Lvl 10.8  2.8 - 20.0 (mg/dL)   GLUCOSE, CAPILLARY     Status: Abnormal   Collection Time   03/09/12 11:51 AM      Component Value Range Comment   Glucose-Capillary 109 (*) 70 - 99 (mg/dL)     Dg Chest Port 1 View  03/09/2012  *RADIOLOGY REPORT*  Clinical Data: Assess for pulmonary edema.  Shortness of breath.  PORTABLE CHEST - 1 VIEW  Comparison: 11/29/2010  Findings: Stable mild cardiomegaly.  Prior median sternotomy noted. Slight pulmonary vascular congestion is present.  No evidence of edema or focal airspace disease.  No visible pleural effusion.  IMPRESSION: Cardiomegaly with slight pulmonary vascular congestion.  Prior median sternotomy.  Original Report Authenticated By: Britta Mccreedy, M.D.    Review of Systems  Unable to perform ROS: other   Blood pressure 105/41, pulse 86, temperature 98.5 F (36.9 C), temperature source Oral, resp. rate 20, height 5\' 1"  (1.549 m), weight 103 kg (227 lb 1.2 oz), SpO2 99.00%. Physical Exam  Assessment/Plan: Chart reviewed, Discussed with Dr Waymon Amato and mother; interviewed pt with Psych CSW Pt says she took aspirin pills because she was in a lot of emotional pain - she cites as a girl in the group home who has been bullying her and pt's desire to be with her aunt who died three years ago.  She says she misses her aunt but cannot remember any 'fun or special times' spent with the aunt.  She says she also bit her  R arm because she was upset.  She says she  stays at a group home that is the best she has ever been in.  [She had been placed in others earlier where she stayed a very short time ].  She has been in this group home ~ 5 months.  She is crying when talking with her mother who is telling her that she would be dead just like her grandfather and aunt.  Mother is encouraged to return home to get some rest.  Grandmother and mother leave just before intervies.  Pt is calm during evaluation.  She has good eye contact.  She responds with simple direct statements.  When asked for past information, especially how recently she attempted to kill herself and how recently she had been at [facility],  she responds, "I don't remember."  She wants to return to her group home.  She says she feels suicidal often.  She has no homicidal thoughts.  She says she sees people who are mean, scary.  She denies hearing voices.  This pt with VCF DiGeorge Syndrome is obese and very impulsive.  Her mother warns she cannot be left alone.  Mother locks everything up.  She does not know how she found any aspirin and does not know  How she came home [for a visit] with a Friday the Thirteenth DVD. - a movie she is not allowed to watch.  Given her impulsive suicide attempts, her inability to process deaths of loved ones, recent harrassment at her group home and the several times she has been in inpatient psychiatric units for her suicide attempts, it is unlikely that she may benefit from inpatient psychiatric admission.  According to mother, she is most safe in her group home where eveyrthing is locked up.  It is not clear why this patient has both FGA Haldol 2 mg daily with SGA Seroquel 200 mg Daily. RECOMMENDATION 1. Suggest sitter for safety 2. Agree with Prozac Rx 3. Consider an increase of Seroquel 250 mg or 300 mg to use SGA with fewer SE risks, with adequate blockage of dopamine receptors.  4 If agreed, discontinue haldol  5. Pt needs therapy to learn and reinforce skills to self soothe  e.g. Dialectical Behavioral Therapy for one who attempts suicide so many times, rather than focus on deaths - which predominates her thoughts already.  She has limited ways to process abstract thoughts of death and appears to exacerbate her grief and suicidality.  6 It is of doubtful benefit to consider inpatient psychiatric admission.  Would suggest weekly to biweekly therapy at her group home.  Will discuss with Dr. Waymon Amato.  7. Emphasize that it is extremely important to minimize any contact with person[s] who would harrass or bully this pt.  6. Will follow pt.    Bali Lyn 03/09/2012, 12:04 PM

## 2012-03-09 NOTE — Progress Notes (Signed)
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 Only 112/175 tablets noted in bottle (63 missing), received activated charcoal in ED, on Bicarb drip. Lives in a group home with "foster mother" . VSS, safety sitter at bedside.

## 2012-03-09 NOTE — Progress Notes (Signed)
LM for Pt's mother and guardian, Mrs. Lonia Blood, stating that CSW and psych MD would like to meet with her tomorrow to discuss d/c plans.  CSW to continue to follow.  Providence Crosby, LCSWA Clinical Social Work [phone]

## 2012-03-09 NOTE — Progress Notes (Signed)
Subjective:   Chart reviewed. Patient denies complaints. She is requesting the Foley catheter to be removed. She denies suicidal or homicidal ideations or delusions or hallucinations. She tells [place] that she took the aspirin to "experiment". No nausea or vomiting or tinnitus. As per nursing, no acute events.  Objective  Vital signs in last 24 hours: Filed Vitals:   03/09/12 1100 03/09/12 1155 03/09/12 1200 03/09/12 1417  BP:  113/50  118/62  Pulse: 86 80  82  Temp:   99.3 F (37.4 C) 98.4 F (36.9 C)  TempSrc:   Oral Oral  Resp: 20 19  20   Height:      Weight:      SpO2: 99% 98%  100%   Weight change:   Intake/Output Summary (Last 24 hours) at 03/09/12 1733 Last data filed at 03/09/12 1410  Gross per 24 hour  Intake   2845 ml  Output   1402 ml  Net   1443 ml    Physical Exam:  General Exam: Comfortable.  Respiratory System: Clear. No increased work of breathing.  Cardiovascular System: First and second heart sounds heard. Regular rate and rhythm. No JVD/murmurs. Telemetry shows normal sinus rhythm in 70's. Gastrointestinal System: Abdomen is non distended, soft and normal bowel sounds heard.  Central Nervous System: Alert and oriented. No focal neurological deficits.  Labs:  Basic Metabolic Panel:  Lab 03/09/12 1610 03/09/12 0145 03/08/12 1935  NA 138 139 136  K 3.2* 3.1* 3.6  CL 103 103 102  CO2 24 21 20   GLUCOSE 99 91 88  BUN 11 8 8   CREATININE 0.78 0.68 0.66  CALCIUM 8.5 8.7 9.4  ALB -- -- --  PHOS -- -- --   Liver Function Tests:  Lab 03/08/12 1935  AST 15  ALT 14  ALKPHOS 119*  BILITOT 0.1*  PROT 8.2  ALBUMIN 3.9   No results found for this basename: LIPASE:3,AMYLASE:3 in the last 168 hours No results found for this basename: AMMONIA:3 in the last 168 hours CBC:  Lab 03/08/12 1935  WBC 11.0*  NEUTROABS 7.3  HGB 14.1  HCT 41.6  MCV 87.9  PLT 140*   Cardiac Enzymes: No results found for this basename:  CKTOTAL:5,CKMB:5,CKMBINDEX:5,TROPONINI:5 in the last 168 hours CBG:  Lab 03/09/12 1151 03/09/12 0732 03/09/12 0614 03/09/12 0145  GLUCAP 109* 96 101* 103*    Iron Studies: No results found for this basename: IRON,TIBC,TRANSFERRIN,FERRITIN in the last 72 hours Studies/Results: Dg Chest Port 1 View  03/09/2012  *RADIOLOGY REPORT*  Clinical Data: Assess for pulmonary edema.  Shortness of breath.  PORTABLE CHEST - 1 VIEW  Comparison: 11/29/2010  Findings: Stable mild cardiomegaly.  Prior median sternotomy noted. Slight pulmonary vascular congestion is present.  No evidence of edema or focal airspace disease.  No visible pleural effusion.  IMPRESSION: Cardiomegaly with slight pulmonary vascular congestion.  Prior median sternotomy.  Original Report Authenticated By: Britta Mccreedy, M.D.   Medications:    . sodium chloride 75 mL/hr at 03/09/12 0921  . DISCONTD:  sodium bicarbonate infusion 1000 mL 250 mL/hr at 03/09/12 0339      . charcoal activated (NO SORBITOL)  50 g Oral Once  . dextrose  1 ampule Intravenous Once  . FLUoxetine  60 mg Oral Daily  . haloperidol  1 mg Oral BID  . mulitivitamin with minerals  1 tablet Oral Daily  . potassium chloride  40 mEq Oral Once  . QUEtiapine  200 mg Oral QHS  . sodium chloride  1,000 mL Intravenous Once    I  have reviewed scheduled and prn medications.     Problem/Plan: Principal Problem:  *Salicylate overdose Active Problems:  Bipolar 1 disorder  DiGeorge syndrome  Psychosis  Cognitive impairment  1. Intentional aspirin/salicylate overdose: Poison control had been consulted on admission. She has been on a bicarbonate drip. She is asymptomatic and her salicylate levels have improved. Discussed with poison control center/Ms. Coralyn Mark recommended discontinuing the bicarbonate drip and no further followup of salicylate levels. 2. Possible suicide attempt: Continue one-on-one sitter and psychiatry consulted for evaluation and  management. 3. Hypokalemia: Replete and follow a BMP tomorrow. 4. Mild thrombocytopenia: Unclear etiology. Stable. Follow CBC tomorrow. 5. DiGeorge syndrome with cognitive impairment: Patient resided at the group home with her foster mother prior to admission.  Disposition: We'll transfer to medical bed. Further plans based on psychiatry input.  Addendum: Discussed with Dr. Ferol Luz, psychiatry and her note appreciated. We'll discontinue Haldol and increase Seroquel to 250 mg by mouth at bedtime. Psychiatry team plans to meet with patient's mother tomorrow morning.  Judy Burns 03/09/2012,5:33 PM  LOS: 1 day

## 2012-03-10 ENCOUNTER — Encounter (HOSPITAL_COMMUNITY): Payer: Self-pay

## 2012-03-10 LAB — CBC
Platelets: 140 10*3/uL — ABNORMAL LOW (ref 150–400)
RDW: 12.8 % (ref 11.5–15.5)
WBC: 7 10*3/uL (ref 4.0–10.5)

## 2012-03-10 LAB — BASIC METABOLIC PANEL
Calcium: 8.9 mg/dL (ref 8.4–10.5)
Chloride: 105 mEq/L (ref 96–112)
Creatinine, Ser: 0.66 mg/dL (ref 0.50–1.10)
GFR calc Af Amer: >90 mL/min (ref >90)
GFR calc non Af Amer: >90 mL/min (ref >90)

## 2012-03-10 NOTE — Progress Notes (Signed)
Subjective:   Denies complaints. Denies suicidal ideations. As per nursing, no acute issues.   Objective  Vital signs in last 24 hours: Filed Vitals:   03/09/12 1200 03/09/12 1417 03/09/12 2135 03/10/12 0600  BP:  118/62 112/54 100/63  Pulse:  82 80 66  Temp: 99.3 F (37.4 C) 98.4 F (36.9 C) 98.3 F (36.8 C) 98.4 F (36.9 C)  TempSrc: Oral Oral Oral Oral  Resp:  20 18 20   Height:      Weight:      SpO2:  100% 100% 98%   Weight change:   Intake/Output Summary (Last 24 hours) at 03/10/12 1254 Last data filed at 03/10/12 1241  Gross per 24 hour  Intake   2715 ml  Output      7 ml  Net   2708 ml    Physical Exam:  General Exam: Comfortable.  Respiratory System: Clear. No increased work of breathing.  Cardiovascular System: First and second heart sounds heard. Regular rate and rhythm. No JVD/murmurs. Gastrointestinal System: Abdomen is non distended, soft and normal bowel sounds heard.  Central Nervous System: Alert and oriented. No focal neurological deficits. Psychiatry: Flat affect. Cooperative.  Labs:  Basic Metabolic Panel:  Lab 03/10/12 4540 03/09/12 0537 03/09/12 0145  NA 135 138 139  K 3.8 3.2* 3.1*  CL 105 103 103  CO2 21 24 21   GLUCOSE 87 99 91  BUN 7 11 8   CREATININE 0.66 0.78 0.68  CALCIUM 8.9 8.5 8.7  ALB -- -- --  PHOS -- -- --   Liver Function Tests:  Lab 03/08/12 1935  AST 15  ALT 14  ALKPHOS 119*  BILITOT 0.1*  PROT 8.2  ALBUMIN 3.9   No results found for this basename: LIPASE:3,AMYLASE:3 in the last 168 hours No results found for this basename: AMMONIA:3 in the last 168 hours CBC:  Lab 03/10/12 0500 03/08/12 1935  WBC 7.0 11.0*  NEUTROABS -- 7.3  HGB 12.8 14.1  HCT 39.0 41.6  MCV 90.1 87.9  PLT 140* 140*   Cardiac Enzymes: No results found for this basename: CKTOTAL:5,CKMB:5,CKMBINDEX:5,TROPONINI:5 in the last 168 hours CBG:  Lab 03/09/12 1151 03/09/12 0732 03/09/12 0614 03/09/12 0145  GLUCAP 109* 96 101* 103*    Iron  Studies: No results found for this basename: IRON,TIBC,TRANSFERRIN,FERRITIN in the last 72 hours Studies/Results: Dg Chest Port 1 View  03/09/2012  *RADIOLOGY REPORT*  Clinical Data: Assess for pulmonary edema.  Shortness of breath.  PORTABLE CHEST - 1 VIEW  Comparison: 11/29/2010  Findings: Stable mild cardiomegaly.  Prior median sternotomy noted. Slight pulmonary vascular congestion is present.  No evidence of edema or focal airspace disease.  No visible pleural effusion.  IMPRESSION: Cardiomegaly with slight pulmonary vascular congestion.  Prior median sternotomy.  Original Report Authenticated By: Britta Mccreedy, M.D.   Medications:    . sodium chloride 75 mL/hr at 03/09/12 0921      . FLUoxetine  60 mg Oral Daily  . mulitivitamin with minerals  1 tablet Oral Daily  . QUEtiapine  250 mg Oral QHS  . DISCONTD: haloperidol  1 mg Oral BID  . DISCONTD: QUEtiapine  200 mg Oral QHS    I  have reviewed scheduled and prn medications.     Problem/Plan: Principal Problem:  *Salicylate overdose Active Problems:  Bipolar 1 disorder  DiGeorge syndrome  Psychosis  Cognitive impairment  1. Intentional aspirin/salicylate overdose: Off bicarbonate drip on 4/17. Salicylate level significantly improved compared to that on admission. Asymptomatic. 2.  Possible suicide attempt: Continue one-on-one sitter and psychiatry input appreciated and followup pending. Based on psychiatry recommendations, discontinued Haldol and increased Seroquel to 250 mg by mouth each bedtime on 4/17. 3. Hypokalemia: Repleted. 4. Mild thrombocytopenia: Unclear etiology. Stable. No bleeding. 5. DiGeorge syndrome with cognitive impairment: Patient resided at the group home prior to admission.  Disposition: Pending psychiatry recommendations.  Kenedee Molesky 03/10/2012,12:54 PM  LOS: 2 days

## 2012-03-10 NOTE — Clinical Documentation Improvement (Signed)
BMI DOCUMENTATION CLARIFICATION QUERY  THIS DOCUMENT IS NOT A PERMANENT PART OF THE MEDICAL RECORD  TO RESPOND TO THE THIS QUERY, FOLLOW THE INSTRUCTIONS BELOW:  1. If needed, update documentation for the patient's encounter via the notes activity.  2. Access this query again and click edit on the In [employer].  3. After updating, or not, click F2 to complete all highlighted (required) fields concerning your review. Select "additional documentation in the medical record" OR "no additional documentation provided".  4. Click Sign note button.  5. The deficiency will fall out of your In Basket *Please let us know if you are not able to complete this workflow by phone or e-mail (listed below).         03/10/12  Dear Dr. Waymon Amato Marton Redwood  In an effort to better capture your patient's severity of illness, reflect appropriate length of stay and utilization of resources, a review of the patient medical record has revealed the following indicators.    Based on your clinical judgment, please clarify and document in a progress note and/or discharge summary the clinical condition associated with the following supporting information:  In responding to this query please exercise your independent judgment.  The fact that a query is asked, does not imply that any particular answer is desired or expected. Pt admitted  for  ASA toxicity OD management.  According to Consult on 03/09/12 noted " This pt with VCF DiGeorge Syndrome is obese " Noted in documentation flowsheet BMI=43  Please clarify in progress notes whether  obese can be further specified as any one of listings below.   Possible Clinical conditions  Morbid Obesity W/ BMI= 43  Overweight  W/ BMI=43  Other condition___________________  Cannot Clinically determine _____________  Signs & Symptoms: Weight:227 lbs   Height: 48ft 1in   BMI = 43     Reviewed: additional documentation in the medical record  Thank  You,  Andy Gauss RN  Clinical Documentation Specialist:  Pager [phone] E-mail [email or web address]   Health Information Management [place]
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
## 2012-03-11 DIAGNOSIS — F062 Psychotic disorder with delusions due to known physiological condition: Secondary | ICD-10-CM | POA: Diagnosis present

## 2012-03-11 DIAGNOSIS — F4329 Adjustment disorder with other symptoms: Secondary | ICD-10-CM | POA: Diagnosis present

## 2012-03-11 MED ORDER — QUETIAPINE FUMARATE 50 MG PO TABS: 50.0000 mg | ORAL_TABLET | Freq: Every day | ORAL | Status: DC

## 2012-03-11 MED ORDER — FLUOXETINE HCL 20 MG PO CAPS: 60.0000 mg | ORAL_CAPSULE | Freq: Every day | ORAL | Status: DC

## 2012-03-11 MED ORDER — QUETIAPINE FUMARATE 200 MG PO TABS: 200.0000 mg | ORAL_TABLET | Freq: Every day | ORAL | Status: DC

## 2012-03-11 NOTE — Progress Notes (Signed)
T/c with Pt's mom.  Rescheduled meeting for today at 1400.  CSW to continue to follow.  Providence Crosby, LCSWA Clinical Social Work [phone]
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
## 2012-03-11 NOTE — Progress Notes (Signed)
Met with psych MD and Pt's mom in conference room.  Psych MD discussed with Pt's mom the need to help Pt, at this time, distance herself from the deaths that occurred 3 years ago, as Pt may not be able to emotionally handle the strong emotions associated with grief, at this time.  Psych MD encouraged Pt's mom to assist Pt with remembering and experiencing the positive emotions associated with her loved ones.  Pt's mom was receptive and stated that she will be mindful of her own grief and to attempt to experience her own grief in private, away from Pt.  Pt's mom states that she has put in her notice at work and intends to focus on helping Pt get her life in order.  Pt's mom is going to put Pt into a new day program in the hopes of bringing a more positive light into Pt's life.  Pt will resume outpt tx at [facility].  Notified MD that Pt cleared by psych  Pt to be d/c'd.  Providence Crosby, LCSWA Clinical Social Work [phone]

## 2012-03-11 NOTE — Progress Notes (Signed)
Subjective:   Denies complaints. Denies suicidal ideations. As per nursing, no acute issues.   Objective  Vital signs in last 24 hours: Filed Vitals:   03/10/12 1412 03/10/12 2143 03/11/12 0540 03/11/12 1442  BP: 97/65 106/66 103/67 121/72  Pulse: 96 85 84 103  Temp: 98.4 F (36.9 C) 98.8 F (37.1 C) 98.5 F (36.9 C) 98.6 F (37 C)  TempSrc: Oral Oral Oral Oral  Resp: 18 20 20 18   Height:      Weight:      SpO2: 98% 98% 99% 99%   Weight change:   Intake/Output Summary (Last 24 hours) at 03/11/12 1511 Last data filed at 03/11/12 1443  Gross per 24 hour  Intake   1080 ml  Output      5 ml  Net   1075 ml    Physical Exam:  General Exam: Comfortable.  Respiratory System: Clear. No increased work of breathing.  Cardiovascular System: First and second heart sounds heard. Regular rate and rhythm. No JVD/murmurs. Gastrointestinal System: Abdomen is non distended, soft and normal bowel sounds heard.  Central Nervous System: Alert and oriented. No focal neurological deficits. Psychiatry: Pleasant and Cooperative.  Labs:  Basic Metabolic Panel:  Lab 03/10/12 0981 03/09/12 0537 03/09/12 0145  NA 135 138 139  K 3.8 3.2* 3.1*  CL 105 103 103  CO2 21 24 21   GLUCOSE 87 99 91  BUN 7 11 8   CREATININE 0.66 0.78 0.68  CALCIUM 8.9 8.5 8.7  ALB -- -- --  PHOS -- -- --   Liver Function Tests:  Lab 03/08/12 1935  AST 15  ALT 14  ALKPHOS 119*  BILITOT 0.1*  PROT 8.2  ALBUMIN 3.9   No results found for this basename: LIPASE:3,AMYLASE:3 in the last 168 hours No results found for this basename: AMMONIA:3 in the last 168 hours CBC:  Lab 03/10/12 0500 03/08/12 1935  WBC 7.0 11.0*  NEUTROABS -- 7.3  HGB 12.8 14.1  HCT 39.0 41.6  MCV 90.1 87.9  PLT 140* 140*   Cardiac Enzymes: No results found for this basename: CKTOTAL:5,CKMB:5,CKMBINDEX:5,TROPONINI:5 in the last 168 hours CBG:  Lab 03/09/12 1151 03/09/12 0732 03/09/12 0614 03/09/12 0145  GLUCAP 109* 96 101* 103*     Iron Studies: No results found for this basename: IRON,TIBC,TRANSFERRIN,FERRITIN in the last 72 hours Studies/Results: No results found. Medications:      . FLUoxetine  60 mg Oral Daily  . mulitivitamin with minerals  1 tablet Oral Daily  . QUEtiapine  250 mg Oral QHS    I  have reviewed scheduled and prn medications.     Problem/Plan: Principal Problem:  *Salicylate overdose Active Problems:  Bipolar 1 disorder  DiGeorge syndrome  Psychosis  Cognitive impairment  1. Intentional aspirin/salicylate overdose: Off bicarbonate drip on 4/17. Salicylate level significantly improved compared to that on admission. Asymptomatic. 2. Possible suicide attempt: Continue one-on-one sitter and psychiatry input appreciated and followup pending. Based on psychiatry recommendations, discontinued Haldol and increased Seroquel to 250 mg by mouth each bedtime on 4/17. 3. Hypokalemia: Repleted. 4. Mild thrombocytopenia: Unclear etiology. Stable. No bleeding. 5. DiGeorge syndrome with cognitive impairment: Patient resided at the group home prior to admission.  Disposition: Pending psychiatry recommendations. They plan to meet with patient and mother this afternoon.  Judy Burns 03/11/2012,3:11 PM  LOS: 3 days

## 2012-03-11 NOTE — Progress Notes (Signed)
Patient discharged home with mother (legal guardian) in stable condition.  Discharge instructions given to patient and mother with understanding.  New prescriptions written and explained to mother with understanding.

## 2012-03-11 NOTE — Progress Notes (Signed)
Met with Pt re: d/c plans.  Pt appeared alert and had a bright affect today.  Pt reports that she would now like to go home with her mother but is concerned that psych MD will not allow this.  CSW explained that psych MD can only provided recommendations and that, ultimately, Pt's mother, as guardian, has the final say.    CSW stated that psych MD, CSW, Pt and Pt's mom will meet today in Pt's room at 1400 to discuss d/c plans further.  Pt voiced an understanding and requested to call her mom.  Asked NT to assist Pt in calling her mom.  CSW to continue to follow.  Providence Crosby, LCSWA Clinical Social Work [phone]
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
## 2012-04-27 ENCOUNTER — Encounter (HOSPITAL_COMMUNITY): Payer: Self-pay | Admitting: Emergency Medicine

## 2012-04-27 ENCOUNTER — Emergency Department (HOSPITAL_COMMUNITY)
Admission: EM | Admit: 2012-04-27 | Discharge: 2012-04-28 | Disposition: A | Discharge status: Home / Self Care | Payer: Medicare Other | Attending: Emergency Medicine | Admitting: Emergency Medicine

## 2012-04-27 DIAGNOSIS — F909 Attention-deficit hyperactivity disorder, unspecified type: Secondary | ICD-10-CM | POA: Insufficient documentation

## 2012-04-27 DIAGNOSIS — S61509A Unspecified open wound of unspecified wrist, initial encounter: Secondary | ICD-10-CM | POA: Insufficient documentation

## 2012-04-27 DIAGNOSIS — F845 Asperger's syndrome: Secondary | ICD-10-CM

## 2012-04-27 DIAGNOSIS — F4310 Post-traumatic stress disorder, unspecified: Secondary | ICD-10-CM

## 2012-04-27 DIAGNOSIS — F848 Other pervasive developmental disorders: Secondary | ICD-10-CM | POA: Insufficient documentation

## 2012-04-27 DIAGNOSIS — X789XXA Intentional self-harm by unspecified sharp object, initial encounter: Secondary | ICD-10-CM | POA: Insufficient documentation

## 2012-04-27 DIAGNOSIS — F329 Major depressive disorder, single episode, unspecified: Secondary | ICD-10-CM

## 2012-04-27 DIAGNOSIS — F319 Bipolar disorder, unspecified: Secondary | ICD-10-CM | POA: Insufficient documentation

## 2012-04-27 DIAGNOSIS — R110 Nausea: Secondary | ICD-10-CM | POA: Insufficient documentation

## 2012-04-27 HISTORY — DX: Asperger's syndrome: F84.5

## 2012-04-27 LAB — CBC
Hemoglobin: 13 g/dL (ref 12.0–15.0)
Platelets: 175 10*3/uL (ref 150–400)
RBC: 4.31 MIL/uL (ref 3.87–5.11)

## 2012-04-27 LAB — DIFFERENTIAL
Basophils Relative: 0 % (ref 0–1)
Eosinophils Absolute: 0.3 10*3/uL (ref 0.0–0.7)
Eosinophils Relative: 2 % (ref 0–5)
Lymphocytes Relative: 9 % — ABNORMAL LOW (ref 12–46)
Neutrophils Relative %: 83 % — ABNORMAL HIGH (ref 43–77)

## 2012-04-27 LAB — URINALYSIS, ROUTINE W REFLEX MICROSCOPIC
Hgb urine dipstick: NEGATIVE
Protein, ur: NEGATIVE mg/dL
Specific Gravity, Urine: 1.018 (ref 1.005–1.030)
Urobilinogen, UA: 0.2 mg/dL (ref 0.0–1.0)

## 2012-04-27 LAB — BASIC METABOLIC PANEL
CO2: 22 mEq/L (ref 19–32)
GFR calc non Af Amer: >90 mL/min (ref >90)
Glucose, Bld: 100 mg/dL — ABNORMAL HIGH (ref 70–99)
Potassium: 3.8 mEq/L (ref 3.5–5.1)
Sodium: 138 mEq/L (ref 135–145)

## 2012-04-27 LAB — RAPID URINE DRUG SCREEN, HOSP PERFORMED
Amphetamines: NOT DETECTED
Benzodiazepines: NOT DETECTED
Tetrahydrocannabinol: NOT DETECTED

## 2012-04-27 LAB — ACETAMINOPHEN LEVEL: Acetaminophen (Tylenol), Serum: <15 ug/mL (ref 10–30)

## 2012-04-27 LAB — SALICYLATE LEVEL: Salicylate Lvl: 0.1 mg/dL — ABNORMAL LOW (ref 2.8–20.0)

## 2012-04-27 LAB — ETHANOL: Alcohol, Ethyl (B): <11 mg/dL (ref 0–11)

## 2012-04-27 LAB — URINE MICROSCOPIC-ADD ON

## 2012-04-27 MED ORDER — ONDANSETRON 4 MG PO TBDP
4.0000 mg | ORAL_TABLET | Freq: Once | ORAL | Status: AC
  Administered 2012-04-27: 4 mg via ORAL
  Filled 2012-04-27: qty 1

## 2012-04-27 MED ORDER — GUANFACINE HCL ER 1 MG PO TB24
1.0000 mg | ORAL_TABLET | Freq: Every day | ORAL | Status: DC
  Administered 2012-04-28: 1 mg via ORAL
  Filled 2012-04-27 (×2): qty 1

## 2012-04-27 MED ORDER — FLUOXETINE HCL 20 MG PO CAPS
60.0000 mg | ORAL_CAPSULE | Freq: Every day | ORAL | Status: DC
  Administered 2012-04-28: 60 mg via ORAL
  Filled 2012-04-27 (×2): qty 3

## 2012-04-27 MED ORDER — QUETIAPINE FUMARATE 300 MG PO TABS: 300.0000 mg | ORAL_TABLET | Freq: Every day | ORAL | Status: DC

## 2012-04-27 NOTE — ED Notes (Signed)
Sitter at pt bedside. Lab drawing blood.
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
## 2012-04-27 NOTE — BH Assessment (Addendum)
Assessment Note   Judy Burns is an 21 y.o. female. Pt has mild MR and is currently living with her mother, Judy Burns.  Judy Burns reports pt has history of certain triggers that make her want to hurt herself--one of them is watching horror movies.  Today, pt came home from her day program with a horror movie.  She did not watch it, but mother confronted her about taking the movie home.  Pt proceeded to have a significant tantrum where she stormed about the house "throwing and smashing things."  Pt also made several cuts on right wrist-no stitches.  Mother has decided that pt will have to re-enter group home after this latest outburst.  Mom has group home placement in place and pt could enter group home tomorrow.  Pt denies stressors, mom also says that prior to today, things were going quite well.  Pt denies SI/HI/AV at this time.  Pt does report she had AV hallucinations one week ago but mom is does not think so.  Pt reports "38" prior suicide attempts with multiple hospitalizations.  Most recent hospitalization 10/2011.  Axis I: ADHD, hyperactive type, Asperger's Disorder, Bipolar, Depressed, Depressive Disorder NOS and Post Traumatic Stress Disorder Axis II: Mild MR Axis III:  Past Medical History  Diagnosis Date  . Bipolar 1 disorder   . Asperger's syndrome    Axis IV: none Axis V: 41-50 serious symptoms  Past Medical History:  Past Medical History  Diagnosis Date  . Bipolar 1 disorder   . Asperger's syndrome     History reviewed. No pertinent past surgical history.  Family History: No family history on file.  Social History:  reports that she has been smoking.  She does not have any smokeless tobacco history on file. She reports that she does not drink alcohol or use illicit drugs.  Additional Social History:  Alcohol / Drug Use Pain Medications: Pt denies, mother denies concerns regarding substance abuse Prescriptions: pt denies Over the Counter: Pt denies History of alcohol /  drug use?: No history of alcohol / drug abuse  CIWA: CIWA-Ar BP: 104/68 mmHg Pulse Rate: 77  COWS:    Allergies:  Allergies  Allergen Reactions  . Carrot Flavor (Flavoring Agent)     Home Medications:  (Not in a hospital admission)  OB/GYN Status:  No LMP recorded.  General Assessment Data Location of Assessment: [facility] ED ACT Assessment: Yes Living Arrangements: Parent Can pt return to current living arrangement?: No (mother has arranged group home admit)     Risk to self Suicidal Ideation: No-Not Currently/Within Last 6 Months Suicidal Intent: No Is patient at risk for suicide?: No Suicidal Plan?: No (Pt did make superficial cut on wrist today) Access to Means: No What has been your use of drugs/alcohol within the last 12 months?: denies use Previous Attempts/Gestures: Yes How many times?: 38  Triggers for Past Attempts: Unknown Intentional Self Injurious Behavior: Cutting Family Suicide History: Yes (aunt) Recent stressful life event(s): Other (Comment) (conflict today over a horror movie) Persecutory voices/beliefs?: No Depression: No Substance abuse history and/or treatment for substance abuse?: No Suicide prevention information given to non-admitted patients: Yes  Risk to Others Homicidal Ideation: No Thoughts of Harm to Others: No Current Homicidal Intent: No Current Homicidal Plan: No Access to Homicidal Means: No History of harm to others?: No Assessment of Violence: On admission Violent Behavior Description: major tantrum today, throwing, slamming things Does patient have access to weapons?: No Criminal Charges Pending?: No Does patient have a  court date: No  Psychosis Hallucinations: None noted Delusions: None noted  Mental Status Report Appear/Hygiene: Disheveled Eye Contact: Poor Motor Activity: Unremarkable Speech: Logical/coherent Level of Consciousness: Alert Mood: Apprehensive Affect: Appropriate to circumstance Anxiety Level:  Minimal Thought Processes: Relevant Judgement: Other (Comment) (Mild MR, IQ 54, per mom) Orientation: Person;Place;Time;Situation Obsessive Compulsive Thoughts/Behaviors: None  Cognitive Functioning Concentration: Normal Memory: Recent Intact;Remote Impaired IQ: Below Average Level of Function: Mild MR, IQ 54 Insight: Poor Impulse Control: Poor Appetite: Fair Weight Loss: 5  Weight Gain: 0  Sleep: Decreased Total Hours of Sleep: 2  Vegetative Symptoms: None  ADLScreening [facility] Assessment Services) Patient's cognitive ability adequate to safely complete daily activities?: Yes Patient able to express need for assistance with ADLs?: Yes Independently performs ADLs?: Yes  Abuse/Neglect [facility]) Physical Abuse: Denies Verbal Abuse: Denies Sexual Abuse: Denies  Prior Inpatient Therapy Prior Inpatient Therapy: Yes (multiple prior inpt psych treatments) Prior Therapy Dates: 10/2011 Prior Therapy Facilty/Provider(s):  [facility] Reason for Treatment: psych  Prior Outpatient Therapy Prior Outpatient Therapy: Yes Prior Therapy Dates: current Prior Therapy [facility]ilty/Provider([facility]): [facility] Reason for Treatment: psych  ADL Screening (condition at time of admission) Patient's cognitive ability adequate to safely complete daily activities?: Yes Patient able to express need for assistance with ADLs?: Yes Independently performs ADLs?: Yes Weakness of Legs: None Weakness of Arms/Hands: None  Home Assistive Devices/Equipment Home Assistive Devices/Equipment: None    Abuse/Neglect Assessment (Assessment to be complete while patient is alone) Physical Abuse: Denies Verbal Abuse: Denies Sexual Abuse: Denies Exploitation of patient/patient's resources: Denies Self-Neglect: Denies Values / Beliefs Cultural Requests During Hospitalization: None Spiritual Requests During Hospitalization: None   Advance Directives (For Healthcare) Advance Directive:  (Pt Mild MR,  mother is guardian)    Additional Information 1:1 In Past 12 Months?: No CIRT Risk: Yes Elopement Risk: Yes Does patient have medical clearance?: Yes     Disposition: Discussed pt with Dr Dierdre Highman of [facility] who agrees with plan of attempting to facilitate admission to group home, which mother reports she has arranged and could take place on 04/28/12.  Pt is denying SI/HI.  Need to contact mother in AM to make this happen.    On Site Evaluation by:   Reviewed with Physician:     Lorri Frederick 04/27/2012 11:46 PM

## 2012-04-27 NOTE — ED Notes (Signed)
VHQ:[id number]<EX> Expected date:04/27/12<BR> Expected time:<BR> Means of arrival:<BR> Comments:<BR> EMS 10 GC - suicidal/stable

## 2012-04-27 NOTE — ED Notes (Signed)
Diane, charge nurse notified of need for sitter. Sitter not available at this time.

## 2012-04-27 NOTE — ED Notes (Signed)
Pt vomiting. MD made aware.  

## 2012-04-27 NOTE — ED Notes (Signed)
Pt mother, Delice Bison, can be reached [phone].  She would like to be notified with any changes in plan of care and in the next steps for patient care.
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
## 2012-04-28 MED ORDER — FAMOTIDINE 20 MG PO TABS
20.0000 mg | ORAL_TABLET | Freq: Two times a day (BID) | ORAL | Status: DC
  Administered 2012-04-28: 20 mg via ORAL
  Filled 2012-04-28: qty 1

## 2012-04-28 MED ORDER — DIPHENHYDRAMINE HCL 25 MG PO CAPS
25.0000 mg | ORAL_CAPSULE | ORAL | Status: DC | PRN
  Administered 2012-04-28: 25 mg via ORAL
  Filled 2012-04-28: qty 1

## 2012-04-28 NOTE — ED Notes (Signed)
Pt. Came to psych ed with a gold colored ring with multi-clear stone in it.  Pt. Removed ring without any difficulty and ring was placed in plastic bag with pt.'s name on it and placed in psych locker 39.

## 2012-04-28 NOTE — ED Provider Notes (Signed)
Judy Burns, ACT states MOP here to take patient home then to a group home. She is going to [facility].    Devoria Albe, MD, [facility]   Ward Givens, MD 04/28/12 [phone]

## 2012-04-28 NOTE — ED Notes (Signed)
Linen changed, bath done, oral care done. Pt had BM

## 2012-04-28 NOTE — BHH Counselor (Signed)
Spoke with pt.'s mother and was informed pt. Would be going to group home.  Mother indicated she would be here this afternoon to p/u pt. And transport to group home.

## 2012-04-28 NOTE — Discharge Instructions (Signed)
Please go to [facility].
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
## 2012-04-28 NOTE — ED Notes (Signed)
Patient with MR. She had a reactive episode at home yesterday related to argument with her mother. She has superficial abrasions and no active suicidal ideation. She is scheduled to go to a group home tomorrow and if it is still not suicidal in the morning, I feel this would be most appropriate placement for her. Plan reassess in the morning.  Sunnie Nielsen, MD 04/28/12 [phone]

## 2012-06-27 ENCOUNTER — Ambulatory Visit (HOSPITAL_BASED_OUTPATIENT_CLINIC_OR_DEPARTMENT_OTHER)
Admission: RE | Admit: 2012-06-27 | Discharge: 2012-06-27 | Disposition: A | Discharge status: Home / Self Care | Payer: Medicare Other | Source: Ambulatory Visit | Attending: Nurse Practitioner | Admitting: Nurse Practitioner

## 2012-06-27 ENCOUNTER — Other Ambulatory Visit (HOSPITAL_BASED_OUTPATIENT_CLINIC_OR_DEPARTMENT_OTHER): Payer: Self-pay | Admitting: Nurse Practitioner

## 2012-06-27 DIAGNOSIS — R141 Gas pain: Secondary | ICD-10-CM | POA: Insufficient documentation

## 2012-06-27 DIAGNOSIS — R142 Eructation: Secondary | ICD-10-CM | POA: Insufficient documentation

## 2012-06-27 DIAGNOSIS — R109 Unspecified abdominal pain: Secondary | ICD-10-CM

## 2012-06-28 ENCOUNTER — Encounter (HOSPITAL_COMMUNITY): Payer: Self-pay | Admitting: *Deleted

## 2012-06-28 ENCOUNTER — Emergency Department (HOSPITAL_COMMUNITY)
Admission: EM | Admit: 2012-06-28 | Discharge: 2012-06-28 | Disposition: A | Discharge status: Home / Self Care | Payer: Medicare Other | Attending: Emergency Medicine | Admitting: Emergency Medicine

## 2012-06-28 DIAGNOSIS — M79609 Pain in unspecified limb: Secondary | ICD-10-CM

## 2012-06-28 DIAGNOSIS — M79604 Pain in right leg: Secondary | ICD-10-CM

## 2012-06-28 DIAGNOSIS — N390 Urinary tract infection, site not specified: Secondary | ICD-10-CM | POA: Insufficient documentation

## 2012-06-28 DIAGNOSIS — R10816 Epigastric abdominal tenderness: Secondary | ICD-10-CM | POA: Insufficient documentation

## 2012-06-28 DIAGNOSIS — R011 Cardiac murmur, unspecified: Secondary | ICD-10-CM | POA: Insufficient documentation

## 2012-06-28 DIAGNOSIS — F848 Other pervasive developmental disorders: Secondary | ICD-10-CM | POA: Insufficient documentation

## 2012-06-28 DIAGNOSIS — D821 Di George's syndrome: Secondary | ICD-10-CM | POA: Insufficient documentation

## 2012-06-28 DIAGNOSIS — R609 Edema, unspecified: Secondary | ICD-10-CM | POA: Insufficient documentation

## 2012-06-28 HISTORY — DX: Personal history of (corrected) congenital malformations of heart and circulatory system: Z87.74

## 2012-06-28 HISTORY — DX: Pervasive developmental disorder, unspecified: F84.9

## 2012-06-28 HISTORY — DX: Other specified postprocedural states: Z98.890

## 2012-06-28 LAB — URINALYSIS, ROUTINE W REFLEX MICROSCOPIC
Bilirubin Urine: NEGATIVE
Glucose, UA: NEGATIVE mg/dL
Ketones, ur: NEGATIVE mg/dL
Nitrite: NEGATIVE
Specific Gravity, Urine: 1.026 (ref 1.005–1.030)
pH: 5.5 (ref 5.0–8.0)

## 2012-06-28 LAB — URINE MICROSCOPIC-ADD ON

## 2012-06-28 MED ORDER — NITROFURANTOIN MONOHYD MACRO 100 MG PO CAPS: 100.0000 mg | ORAL_CAPSULE | Freq: Two times a day (BID) | ORAL | Status: AC

## 2012-06-28 MED ORDER — OXYCODONE-ACETAMINOPHEN 5-325 MG PO TABS
2.0000 | ORAL_TABLET | Freq: Once | ORAL | Status: AC
  Administered 2012-06-28: 2 via ORAL
  Filled 2012-06-28: qty 2
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
## 2012-06-28 NOTE — ED Provider Notes (Signed)
History     CSN: [id number]  Arrival date & time 06/28/12  1242   First MD Initiated Contact with Patient 06/28/12 1448      Chief Complaint  Patient presents with  . bilat leg swelling/redness     (Consider location/radiation/quality/duration/timing/severity/associated sxs/prior treatment) HPI  21 year old female with significant congenital disease including Bland Span syndrome, Tetralogy of Fallot, and Asperger's syndrome present for evaluation of lower extremity swelling.  History was obtained through a mom who is her legal guardian.  Went to [place] [place] 2 weeks ago. She came home she suffered a sunburn to her lower extremities she was seen by her primary care doctor last week and her doctor thought it was sun burn.  For the past 2 days endorse an achy sensation to both of her calves and lower extremity with increase leg swelling.  Constant, worsened with movement mom also noticed that her skin looks bluer than usual.  Patient does not endorse fever, chills, chest pain, shortness of breath.  However, mom states pt appears to be out of breath with walking for the past few days.  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  Pt denies any recent surgery.    Pt also endorse generalized abdominal pain for the past 3-4 months.  Pain is sharp and stabbing without n/v.  LBM today and it was normal.  Sts she has been evaluated at Urgent care for same and was given TUMs, which does provide some relief.  Mom believe pain is likely due to her ASA overdose several months ago.    Past Medical History  Diagnosis Date  . Bipolar 1 disorder   . Asperger's syndrome   . Hx of heart surgery   . Tetralogy of Fallot s/p repair   . Bland Span syndrome   . PDD (pervasive developmental disorder)     History reviewed. No pertinent past surgical history.  No family history on file.  History  Substance Use Topics  . Smoking status: Current  Everyday Smoker -- 1.0 packs/day  . Smokeless tobacco: Not on file  . Alcohol Use: No    OB History    Grav Para Term Preterm Abortions TAB SAB Ect Mult Living                  Review of Systems  All other systems reviewed and are negative.    Allergies  Carrot flavor  Home Medications   Current Outpatient Rx  Name Route Sig Dispense Refill  . FLUOXETINE HCL 20 MG PO CAPS Oral Take 3 capsules (60 mg total) by mouth daily. 90 capsule 0  . GUANFACINE HCL ER 1 MG PO TB24 Oral Take 1 mg by mouth daily.    . IBUPROFEN 800 MG PO TABS Oral Take 800 mg by mouth every 8 (eight) hours as needed. headaches    . OMEPRAZOLE 20 MG PO CPDR Oral Take 20 mg by mouth daily.    . QUETIAPINE FUMARATE 300 MG PO TABS Oral Take 300 mg by mouth at bedtime.      BP 152/131  Pulse 83  Temp 99.3 F (37.4 C) (Oral)  Resp 18  Wt 225 lb (102.059 kg)  SpO2 99%  LMP 06/07/2012  Physical Exam  Nursing note and vitals reviewed. Constitutional: She is oriented to person, place, and time. She appears well-developed and well-nourished.       obese  HENT:  Head: Normocephalic and atraumatic.  Eyes: Conjunctivae are normal.  Neck: Neck supple.  Cardiovascular: Normal rate and regular rhythm.   Murmur heard.      3/6 systolic murmur best heard at the 2nd left intercostal place radiates to L axillary  Pulmonary/Chest: Effort normal and breath sounds normal. No respiratory distress. She has no wheezes. She exhibits no tenderness.  Abdominal: Soft. There is tenderness in the epigastric area. There is no rigidity, no rebound, no guarding, no CVA tenderness, no tenderness at McBurney's point and negative Murphy's sign. No hernia. Hernia confirmed negative in the ventral area.  Musculoskeletal:       Lower extremities:  No palpable cords, erythema noted.  Calves tender to palpation bilaterally.  Trace edema noted bilaterally.  Pedal pulses palpable bilat.  Brisk cap refills.    Neurological: She is alert and  oriented to person, place, and time.  Skin: Skin is warm.       Lower extremities with evidence of sunburn, well healing.  No abscess, or cellulitis noted.    Psychiatric: She has a normal mood and affect.    ED Course  Procedures (including critical care time)  Labs Reviewed  URINALYSIS, ROUTINE W REFLEX MICROSCOPIC - Abnormal; Notable for the following:    Leukocytes, UA SMALL (*)     All other components within normal limits  URINE MICROSCOPIC-ADD ON - Abnormal; Notable for the following:    Squamous Epithelial / LPF FEW (*)     Bacteria, UA MANY (*)     All other components within normal limits  POCT PREGNANCY, URINE   Dg Abd 1 View  06/27/2012  *RADIOLOGY REPORT*  Clinical Data: Abdominal distention for several months  ABDOMEN - 1 VIEW  Comparison: None.  Findings: A supine film of the abdomen shows a nonspecific bowel gas pattern.  No bowel obstruction is seen.   No opaque calculi are seen.  No bony abnormality is noted.  IMPRESSION: No bowel obstruction.  No opaque calculi.  Original Report Authenticated By: Juline Patch, M.D.     No diagnosis found.   Date: 06/28/2012  Rate: 112  Rhythm: sinus tachycardia  QRS Axis: normal  Intervals: normal  ST/T Wave abnormalities: normal  Conduction Disutrbances:LVH  Narrative Interpretation:   Old EKG Reviewed: unchanged  Results for orders placed during the hospital encounter of 06/28/12  URINALYSIS, ROUTINE W REFLEX MICROSCOPIC      Component Value Range   Color, Urine YELLOW  YELLOW   APPearance CLEAR  CLEAR   Specific Gravity, Urine 1.026  1.005 - 1.030   pH 5.5  5.0 - 8.0   Glucose, UA NEGATIVE  NEGATIVE mg/dL   Hgb urine dipstick NEGATIVE  NEGATIVE   Bilirubin Urine NEGATIVE  NEGATIVE   Ketones, ur NEGATIVE  NEGATIVE mg/dL   Protein, ur NEGATIVE  NEGATIVE mg/dL   Urobilinogen, UA 0.2  0.0 - 1.0 mg/dL   Nitrite NEGATIVE  NEGATIVE   Leukocytes, UA SMALL (*) NEGATIVE  POCT PREGNANCY, URINE      Component Value Range     Preg Test, Ur NEGATIVE  NEGATIVE  URINE MICROSCOPIC-ADD ON      Component Value Range   Squamous Epithelial / LPF FEW (*) RARE   WBC, UA 3-6  <3 WBC/hpf   Bacteria, UA MANY (*) RARE   Dg Abd 1 View  06/27/2012  *RADIOLOGY REPORT*  Clinical Data: Abdominal distention for several months  ABDOMEN - 1 VIEW  Comparison: None.  Findings: A supine  film of the abdomen shows a nonspecific bowel gas pattern.  No bowel obstruction is seen.   No opaque calculi are seen.  No bony abnormality is noted.  IMPRESSION: No bowel obstruction.  No opaque calculi.  Original Report Authenticated By: Juline Patch, M.D.    Patient Information       Patient Name  Sex  DOB  SSN    Nea, Gittens  Female  03-Nov-1991  [id number]             Progress Notes signed by Gwendolyn Fill at 06/28/12 1630     Author:  Lawrence Marseilles Simonetti  Service:  Vascular Lab  Author Type:  Cardiovascular Sonographer   Filed:  06/28/12 1630  Note Time:  06/28/12 1629          *Preliminary Results*  Bilateral lower extremity venous duplex completed.  Bilateral lower extremities are negative for deep vein thrombosis.  Preliminary results discussed with RN.  06/28/2012 4:29 PM  Gertie Fey, RDMS, RDCS         MDM  Mom is concern of bilateral leg swelling.  Pt does have hx of stroke and hx of heart defect.  Plan to perform vascular study to r/o DVT as pt has some risk factors, including [place] beach.  Discussed with my attending.    5:07 PM Pt has normal DVT study of her lower extremities.  She has no evidence of skin infection, no joint involvement, no deformity noted.  At this time, i recommend pt to f/u with PCP for further evaluation of her sxs.  Pt's urine show no obvious signs of UTI (WBC 3-6, bacteria many). However, pt does endorse urinary discomfort.  Pt request treatment.  Will treat for uti with macrobid.  My attending has seen pt.       Fayrene Helper, PA-C 06/28/12 1711

## 2012-06-28 NOTE — ED Notes (Signed)
Per Judy Burns, Pt. Not able to have food at this time due to vomitting.  Ginger Ale and Sprite OK - pt requested 2 ginger ales.

## 2012-06-28 NOTE — ED Notes (Signed)
Pt stated that she vomitted in the restroom.  Notified Paulo Fruit, RN

## 2012-06-28 NOTE — Progress Notes (Signed)
*  Preliminary Results* Bilateral lower extremity venous duplex completed. Bilateral lower extremities are negative for deep vein thrombosis. Preliminary results discussed with RN.  06/28/2012 4:29 PM Gertie Fey, RDMS, RDCS

## 2012-06-29 NOTE — ED Provider Notes (Signed)
Medical screening examination/treatment/procedure(s) were performed by non-physician practitioner and as supervising physician I was immediately available for consultation/collaboration.   Dione Booze, MD 06/29/12 [phone]

## 2012-08-11 ENCOUNTER — Encounter (HOSPITAL_COMMUNITY): Payer: Self-pay | Admitting: Pharmacist

## 2012-08-17 ENCOUNTER — Encounter (HOSPITAL_COMMUNITY): Payer: Self-pay

## 2012-08-17 ENCOUNTER — Encounter (HOSPITAL_COMMUNITY)
Admission: RE | Admit: 2012-08-17 | Discharge: 2012-08-17 | Disposition: A | Discharge status: Home / Self Care | Payer: Medicaid Other | Source: Ambulatory Visit | Attending: Obstetrics and Gynecology | Admitting: Obstetrics and Gynecology

## 2012-08-17 HISTORY — DX: Gastro-esophageal reflux disease without esophagitis: K21.9

## 2012-08-17 LAB — CBC
HCT: 38.6 % (ref 36.0–46.0)
Hemoglobin: 12.6 g/dL (ref 12.0–15.0)
MCH: 29.4 pg (ref 26.0–34.0)
RBC: 4.29 MIL/uL (ref 3.87–5.11)

## 2012-08-17 NOTE — Patient Instructions (Addendum)
Your procedure is scheduled on:08/24/12  Enter through the Main Entrance at :6am Pick up desk phone and dial [phone] and inform us of your arrival.  Please call [phone] if you have any problems the morning of surgery.  Remember: Do not eat after midnight:Tuesday Do not drink after:midnight on Tuesday  Take these meds the morning of surgery with a sip of water: Prozac  DO NOT wear jewelry, eye make-up, lipstick,body lotion, or dark fingernail polish. Do not shave for 48 hours prior to surgery.  Patients discharged on the day of surgery will not be allowed to drive home.   Remember to use your Hibiclens as instructed.

## 2012-08-23 ENCOUNTER — Encounter (HOSPITAL_COMMUNITY): Payer: Self-pay | Admitting: Anesthesiology

## 2012-08-23 NOTE — H&P (Signed)
21 yo presents for pelvic US & mirena IUD insertion.  Pt with h/o mental disorder requiring sedation for both procedures.  Sexually acitve x 1 yr, currently using condoms for contraception.  Consensual relationship.  Pt also c/o intermittent pelvic pain.  No f/c or n/v.  No change in bowel or bladder habits  PMHx:  Tetralogy of fallot - repaired at 73m of age, GERD, bipolar d/o, PTSD PSHx:  TOF repair Meds:  Omeprazole, quetiapine, fluoxetine, intiniv SHx:  + tobacco, no etoh or drugs All:  Carrots  AF, VSS Gen - NAD CV - murmer, regular rhythm resp - clear Abd - soft, NT PV - difficult to examine due to gaurding.  cvx appears wnl Pap wnl  A/P:  1. contracption - mirena iud 2. Pelvic pain - pelvic US

## 2012-08-23 NOTE — Anesthesia Preprocedure Evaluation (Signed)
Anesthesia Evaluation  Patient identified by MRN, date of birth, ID band Patient awake    Reviewed: Allergy & Precautions, H&P , NPO status , Patient's Chart, lab work & pertinent test results  Airway       Dental   Pulmonary neg pulmonary ROS,          Cardiovascular  Corrected TOF   Neuro/Psych PSYCHIATRIC DISORDERS Bipolar Disorder Developmentally challenged Aspergers syndrome   GI/Hepatic Neg liver ROS, GERD-  Medicated and Controlled,  Endo/Other  DiGeorge Syndrome  Renal/GU negative Renal ROS  negative genitourinary   Musculoskeletal negative musculoskeletal ROS (+)   Abdominal   Peds  Hematology negative hematology ROS (+)   Anesthesia Other Findings   Reproductive/Obstetrics negative OB ROS                           Anesthesia Physical Anesthesia Plan  ASA: III  Anesthesia Plan: General   Post-op Pain Management:    Induction:   Airway Management Planned: LMA  Additional Equipment:   Intra-op Plan:   Post-operative Plan:   Informed Consent: I have reviewed the patients History and Physical, chart, labs and discussed the procedure including the risks, benefits and alternatives for the proposed anesthesia with the patient or authorized representative who has indicated his/her understanding and acceptance.     Plan Discussed with: CRNA, Anesthesiologist and Surgeon  Anesthesia Plan Comments:         Anesthesia Quick Evaluation

## 2012-08-24 ENCOUNTER — Encounter (HOSPITAL_COMMUNITY): Admission: RE | Payer: Self-pay | Source: Ambulatory Visit

## 2012-08-24 ENCOUNTER — Ambulatory Visit (HOSPITAL_COMMUNITY)
Admission: RE | Admit: 2012-08-24 | Payer: Medicare Other | Source: Ambulatory Visit | Admitting: Obstetrics and Gynecology

## 2012-08-24 SURGERY — INSERTION, INTRAUTERINE DEVICE
Anesthesia: Choice

## 2012-08-24 NOTE — Progress Notes (Signed)
Spoke to Dr. Renaldo Fiddler about pt's showing up for surgery at 0645. Pt surgery cancelled and rescheduled. Or aware.

## 2012-08-26 ENCOUNTER — Encounter (HOSPITAL_COMMUNITY): Payer: Self-pay | Admitting: Pharmacist

## 2012-08-30 ENCOUNTER — Ambulatory Visit (HOSPITAL_COMMUNITY): Payer: Medicaid Other | Admitting: Anesthesiology

## 2012-08-30 ENCOUNTER — Encounter (HOSPITAL_COMMUNITY): Payer: Self-pay | Admitting: Anesthesiology

## 2012-08-30 ENCOUNTER — Encounter (HOSPITAL_COMMUNITY)
Admission: RE | Disposition: A | Discharge status: Home / Self Care | Payer: Self-pay | Source: Ambulatory Visit | Attending: Obstetrics and Gynecology

## 2012-08-30 ENCOUNTER — Ambulatory Visit (HOSPITAL_COMMUNITY): Payer: Medicaid Other

## 2012-08-30 ENCOUNTER — Ambulatory Visit (HOSPITAL_COMMUNITY)
Admission: RE | Admit: 2012-08-30 | Discharge: 2012-08-30 | Disposition: A | Discharge status: Home / Self Care | Payer: Medicaid Other | Source: Ambulatory Visit | Attending: Obstetrics and Gynecology | Admitting: Obstetrics and Gynecology

## 2012-08-30 DIAGNOSIS — Z01818 Encounter for other preprocedural examination: Secondary | ICD-10-CM | POA: Insufficient documentation

## 2012-08-30 DIAGNOSIS — N949 Unspecified condition associated with female genital organs and menstrual cycle: Secondary | ICD-10-CM | POA: Insufficient documentation

## 2012-08-30 DIAGNOSIS — Z01812 Encounter for preprocedural laboratory examination: Secondary | ICD-10-CM | POA: Insufficient documentation

## 2012-08-30 DIAGNOSIS — Z30430 Encounter for insertion of intrauterine contraceptive device: Secondary | ICD-10-CM | POA: Insufficient documentation

## 2012-08-30 SURGERY — INSERTION, INTRAUTERINE DEVICE
Anesthesia: General | Site: Vagina | Wound class: Clean Contaminated

## 2012-08-30 MED ORDER — FENTANYL CITRATE 0.05 MG/ML IJ SOLN
INTRAMUSCULAR | Status: AC
  Filled 2012-08-30: qty 5

## 2012-08-30 MED ORDER — MIDAZOLAM HCL 5 MG/5ML IJ SOLN
INTRAMUSCULAR | Status: DC | PRN
  Administered 2012-08-30: 2 mg via INTRAVENOUS

## 2012-08-30 MED ORDER — KETOROLAC TROMETHAMINE 30 MG/ML IJ SOLN: 15.0000 mg | Freq: Once | INTRAMUSCULAR | Status: DC | PRN

## 2012-08-30 MED ORDER — MIDAZOLAM HCL 2 MG/2ML IJ SOLN
INTRAMUSCULAR | Status: AC
  Filled 2012-08-30: qty 2

## 2012-08-30 MED ORDER — IBUPROFEN 200 MG PO TABS: 600.0000 mg | ORAL_TABLET | Freq: Four times a day (QID) | ORAL | Status: DC | PRN

## 2012-08-30 MED ORDER — LACTATED RINGERS IV SOLN
INTRAVENOUS | Status: DC
  Administered 2012-08-30: 12:00:00 via INTRAVENOUS

## 2012-08-30 MED ORDER — KETAMINE HCL 100 MG/ML IJ SOLN
INTRAMUSCULAR | Status: AC
  Filled 2012-08-30: qty 1

## 2012-08-30 MED ORDER — FENTANYL CITRATE 0.05 MG/ML IJ SOLN: 25.0000 ug | INTRAMUSCULAR | Status: DC | PRN

## 2012-08-30 MED ORDER — PROPOFOL 10 MG/ML IV EMUL
INTRAVENOUS | Status: DC | PRN
  Administered 2012-08-30 (×2): 100 mg via INTRAVENOUS
  Administered 2012-08-30: 200 mg via INTRAVENOUS

## 2012-08-30 MED ORDER — MIDAZOLAM HCL 2 MG/2ML IJ SOLN
INTRAMUSCULAR | Status: AC
  Administered 2012-08-30: 0.5 mg via INTRAVENOUS
  Filled 2012-08-30: qty 2

## 2012-08-30 MED ORDER — PROPOFOL 10 MG/ML IV EMUL
INTRAVENOUS | Status: AC
  Filled 2012-08-30: qty 20

## 2012-08-30 MED ORDER — LIDOCAINE HCL 1 % IJ SOLN
INTRAMUSCULAR | Status: DC | PRN
  Administered 2012-08-30: 9 mL

## 2012-08-30 MED ORDER — ALBUTEROL SULFATE HFA 108 (90 BASE) MCG/ACT IN AERS
INHALATION_SPRAY | RESPIRATORY_TRACT | Status: AC
  Filled 2012-08-30: qty 6.7

## 2012-08-30 MED ORDER — MEPERIDINE HCL 25 MG/ML IJ SOLN: 6.2500 mg | INTRAMUSCULAR | Status: DC | PRN

## 2012-08-30 MED ORDER — DEXAMETHASONE SODIUM PHOSPHATE 10 MG/ML IJ SOLN
INTRAMUSCULAR | Status: DC | PRN
  Administered 2012-08-30: 10 mg via INTRAVENOUS

## 2012-08-30 MED ORDER — LIDOCAINE HCL (CARDIAC) 20 MG/ML IV SOLN
INTRAVENOUS | Status: AC
  Filled 2012-08-30: qty 5

## 2012-08-30 MED ORDER — ONDANSETRON HCL 4 MG/2ML IJ SOLN
INTRAMUSCULAR | Status: AC
  Filled 2012-08-30: qty 2

## 2012-08-30 MED ORDER — FENTANYL CITRATE 0.05 MG/ML IJ SOLN
INTRAMUSCULAR | Status: DC | PRN
  Administered 2012-08-30 (×2): 50 ug via INTRAVENOUS
  Administered 2012-08-30: 25 ug via INTRAVENOUS
  Administered 2012-08-30: 50 ug via INTRAVENOUS

## 2012-08-30 MED ORDER — ONDANSETRON HCL 4 MG/2ML IJ SOLN
INTRAMUSCULAR | Status: DC | PRN
  Administered 2012-08-30: 4 mg via INTRAVENOUS

## 2012-08-30 MED ORDER — MIDAZOLAM HCL 2 MG/2ML IJ SOLN
0.5000 mg | Freq: Once | INTRAMUSCULAR | Status: AC | PRN
  Administered 2012-08-30: 1 mg via INTRAVENOUS

## 2012-08-30 MED ORDER — LIDOCAINE HCL (CARDIAC) 20 MG/ML IV SOLN
INTRAVENOUS | Status: DC | PRN
  Administered 2012-08-30: 80 mg via INTRAVENOUS

## 2012-08-30 MED ORDER — PROMETHAZINE HCL 25 MG/ML IJ SOLN: 6.2500 mg | INTRAMUSCULAR | Status: DC | PRN

## 2012-08-30 SURGICAL SUPPLY — 19 items
CATH ROBINSON RED A/P 16FR (CATHETERS) ×2 IMPLANT
CLOTH BEACON ORANGE TIMEOUT ST (SAFETY) ×2 IMPLANT
CONTAINER PREFILL 10% NBF 60ML (FORM) ×4 IMPLANT
DECANTER SPIKE VIAL GLASS SM (MISCELLANEOUS) ×2 IMPLANT
GLOVE BIO SURGEON STRL SZ 6.5 (GLOVE) ×4 IMPLANT
GLOVE BIOGEL PI IND STRL 7.0 (GLOVE) ×2 IMPLANT
GLOVE BIOGEL PI INDICATOR 7.0 (GLOVE) ×2
GOWN PREVENTION PLUS LG XLONG (DISPOSABLE) ×2 IMPLANT
GOWN STRL REIN XL XLG (GOWN DISPOSABLE) ×2 IMPLANT
Mirena ×2 IMPLANT
NDL SPNL 22GX3.5 QUINCKE BK (NEEDLE) ×1 IMPLANT
NEEDLE SPNL 22GX3.5 QUINCKE BK (NEEDLE) ×2 IMPLANT
PACK VAGINAL MINOR WOMEN LF (CUSTOM PROCEDURE TRAY) ×2 IMPLANT
PAD OB MATERNITY 4.3X12.25 (PERSONAL CARE ITEMS) ×2 IMPLANT
PAD PREP 24X48 CUFFED NSTRL (MISCELLANEOUS) ×2 IMPLANT
SUT VICRYL 4-0 PS2 18IN ABS (SUTURE) ×2 IMPLANT
SYR CONTROL 10ML LL (SYRINGE) ×2 IMPLANT
TOWEL OR 17X24 6PK STRL BLUE (TOWEL DISPOSABLE) ×4 IMPLANT
WATER STERILE IRR 1000ML POUR (IV SOLUTION) ×2 IMPLANT

## 2012-08-30 NOTE — Progress Notes (Signed)
Plan of care reviewed with pt and her mom.  Risks & side effects discussed, informed consent

## 2012-08-30 NOTE — Anesthesia Postprocedure Evaluation (Signed)
Anesthesia Post Note  Patient: Judy Burns  Procedure(s) Performed: Procedure(s) (LRB): INTRAUTERINE DEVICE (IUD) INSERTION (N/A)  Anesthesia type: General  Patient location: PACU  Post pain: Pain level controlled  Post assessment: Post-op Vital signs reviewed  Last Vitals:  Filed Vitals:   08/30/12 1123  BP: 135/90  Pulse: 83  Temp: 36.8 C  Resp: 18    Post vital signs: Reviewed  Level of consciousness: sedated  Complications: No apparent anesthesia complicationsfj

## 2012-08-30 NOTE — OR Nursing (Signed)
Dr. Renaldo Fiddler states she does not want SCD's on pt in OR.  SCD's removed from pt before coming to OR.  SCD order followed.

## 2012-08-30 NOTE — Transfer of Care (Signed)
Immediate Anesthesia Transfer of Care Note  Patient: Judy Burns  Procedure(s) Performed: Procedure(s) (LRB) with comments: INTRAUTERINE DEVICE (IUD) INSERTION (N/A) - with U/S  Patient Location: PACU  Anesthesia Type: General  Level of Consciousness: sedated  Airway & Oxygen Therapy: Patient Spontanous Breathing and Patient connected to nasal cannula oxygen  Post-op Assessment: Report given to PACU RN  Post vital signs: Reviewed and stable  Complications: No apparent anesthesia complications

## 2012-08-30 NOTE — OR Nursing (Signed)
Pt's MP3 player given to pt's mother in PACU.

## 2012-08-30 NOTE — Anesthesia Procedure Notes (Addendum)
Procedures

## 2012-08-31 NOTE — Op Note (Signed)
NAMEHOLLIS, Judy Burns          ACCOUNT NO.:  [id number]  MEDICAL RECORD NO.:  [id number]  LOCATION:  [facility]                          FACILITY:  [facility]  PHYSICIAN:  Zelphia Cairo, MD    DATE OF BIRTH:  05/11/1991  DATE OF PROCEDURE: DATE OF DISCHARGE:  08/30/2012                              OPERATIVE REPORT   PREOPERATIVE DIAGNOSES: 1. Desires contraception. 2. Pelvic pain.  POSTOPERATIVE DIAGNOSES: 1. Desires contraception. 2. Pelvic pain.  PROCEDURES: 1. Exam under anesthesia. 2. Cervical block. 3. Mirena IUD insertion. 4. Pelvic ultrasound.  SURGEON:  Zelphia Cairo, MD  ANESTHESIA:  General.  COMPLICATIONS:  None.  CONDITION:  Stable to recovery room.  PROCEDURE:  After informed consent was obtained, the patient was taken to the operating room, where general anesthesia was obtained.  She was placed in the dorsal lithotomy position using Allen stirrups.  She was prepped in sterile fashion, and an in-and-out catheter was used to drain her bladder.  Bivalve speculum was placed in the vagina and the cervix was sterilely cleansed with Betadine.  A 1 mL of 1% lidocaine was injected at 12 o'clock on the cervix and a single-tooth tenaculum was attached to the anterior lip of the cervix.  Cervical block was then performed.  The cervix was then serially dilated using Pratt dilators. The uterus sounded to 7 cm and Mirena IUD was inserted using standard manufacture guidelines.  Strings were trimmed 2 cm from the os.  The tenaculum and speculum were removed and the pelvic ultrasound was performed by [facility] ultrasound staff. Following the ultrasound, she had a small tear at the right hymenal edge, this was repaired with 4-0 Vicryl in a figure-of-eight stitch. Hemostasis was achieved.  She was extubated and taken to the recovery room in stable condition.     Zelphia Cairo, MD     GA/MEDQ  D:  08/30/2012  T:  08/31/2012  Job:  [id number]

## 2013-02-18 IMAGING — CR DG ABDOMEN 3V
1 series · 5 of 5 positions shown · non-contrast
Comparison: none

REASON FOR EXAM: Pain, nausea and vomiting, RUQ pain on exam
COMMENTS:   May transport without cardiac monitor

[Series 1: pa · 0.17mm/px · 5 of 5 slices shown]
[im 1/5]
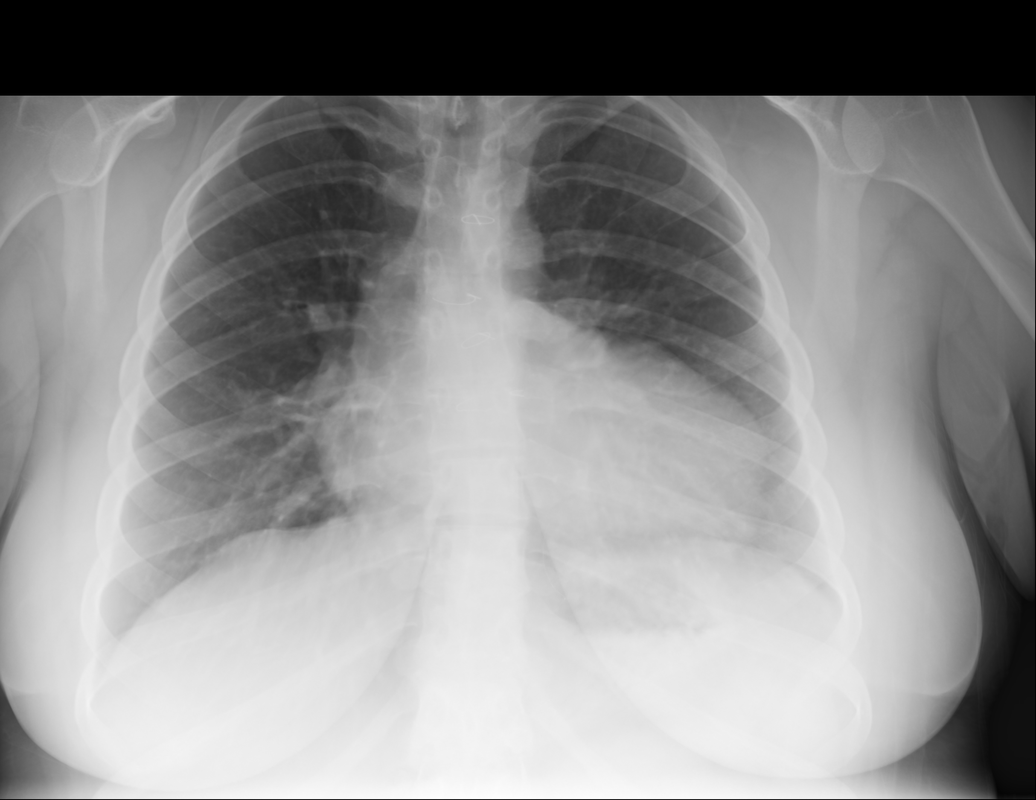
[im 2/5]
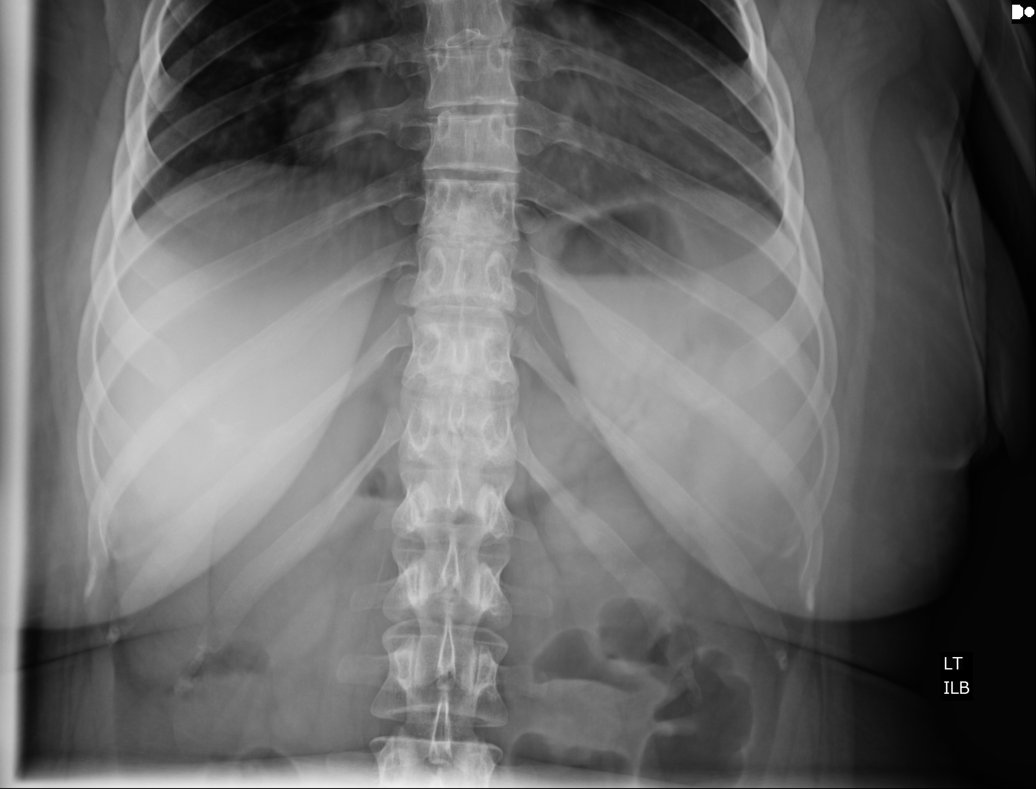
[im 3/5]
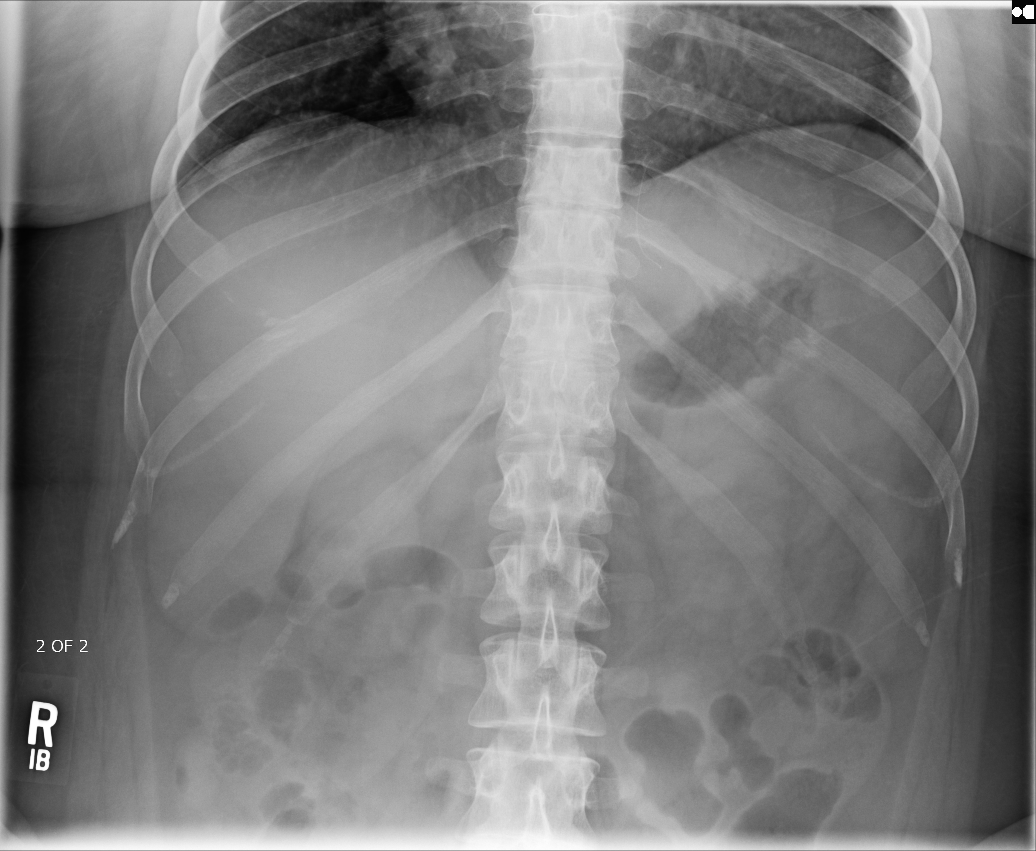
[im 4/5]
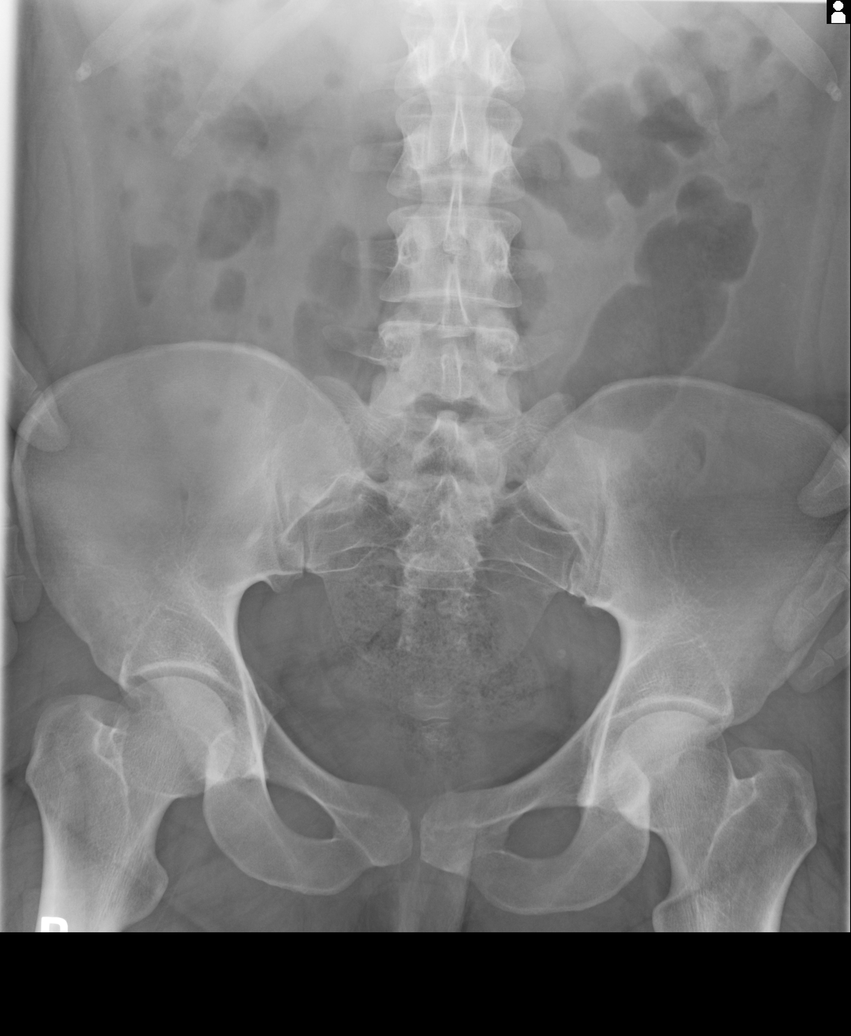
[im 5/5]
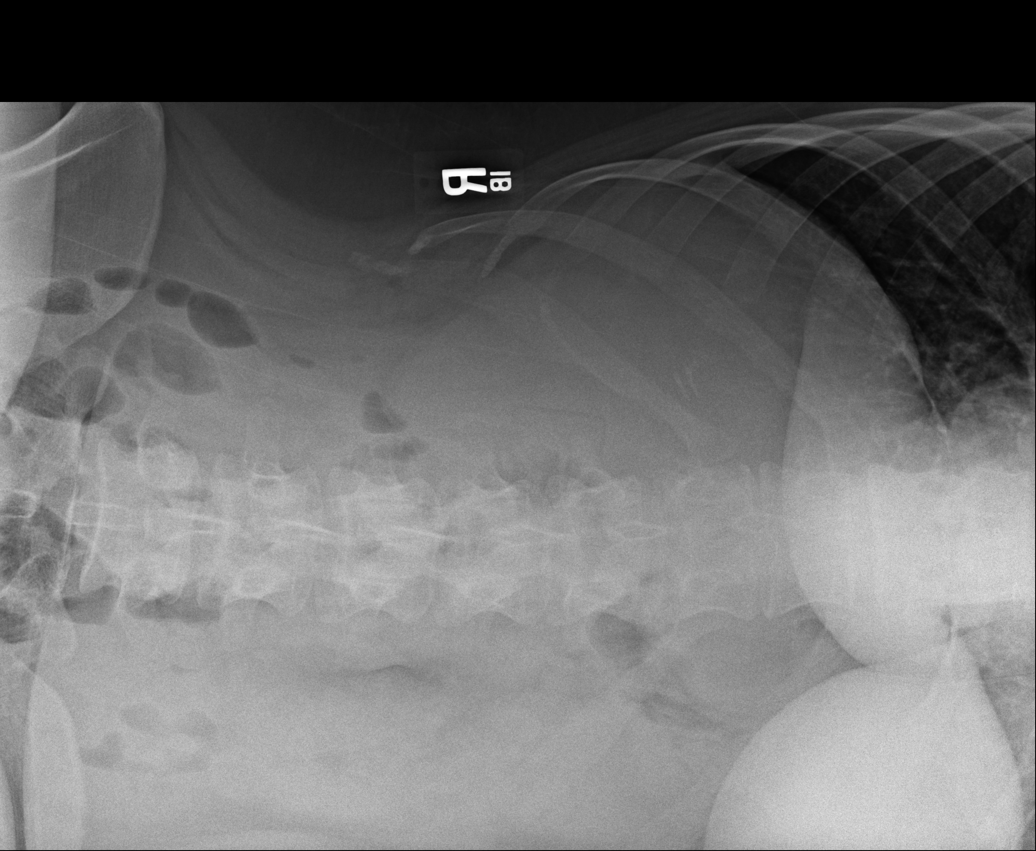

[5 of 5 positions shown; findings below may reference images not displayed]

PROCEDURE:     DXR - DXR ABDOMEN 3-WAY (INCL PA CXR)  - December 29, 2011 [DATE]

RESULT:     PA view of the chest shows the lung fields to be clear. The
heart is mildly enlarged. No interstitial or pulmonary edema is seen.
Multiple views of the abdomen were obtained. No subdiaphragmatic free air is
observed. The bowel gas pattern shows no specific abnormalities. Gas is
visualized in both the large and small bowel. No findings indicative of
bowel obstruction are seen. In the lateral decubitus view there are a few
scattered, nonspecific fluid levels in nondilated loops of small bowel. Such
findings are nonspecific and are frequently seen with gastroenteritis,
enteritis, or intraabdominal inflammation. No abnormal intraabdominal
calcifications are noted.
IMPRESSION: 1. The lung fields are clear.
2. There is mild cardiomegaly.
3. There are scattered, nonspecific fluid levels in nondilated loops of
small bowel.
4. No findings indicative of bowel obstruction are seen.

## 2013-02-28 ENCOUNTER — Emergency Department (HOSPITAL_BASED_OUTPATIENT_CLINIC_OR_DEPARTMENT_OTHER): Payer: Medicare Other

## 2013-02-28 ENCOUNTER — Emergency Department (HOSPITAL_BASED_OUTPATIENT_CLINIC_OR_DEPARTMENT_OTHER)
Admission: EM | Admit: 2013-02-28 | Discharge: 2013-02-28 | Disposition: A | Discharge status: Home / Self Care | Payer: Medicare Other | Attending: Emergency Medicine | Admitting: Emergency Medicine

## 2013-02-28 ENCOUNTER — Encounter (HOSPITAL_BASED_OUTPATIENT_CLINIC_OR_DEPARTMENT_OTHER): Payer: Self-pay | Admitting: *Deleted

## 2013-02-28 DIAGNOSIS — R05 Cough: Secondary | ICD-10-CM | POA: Insufficient documentation

## 2013-02-28 DIAGNOSIS — Z9889 Other specified postprocedural states: Secondary | ICD-10-CM | POA: Insufficient documentation

## 2013-02-28 DIAGNOSIS — J40 Bronchitis, not specified as acute or chronic: Secondary | ICD-10-CM

## 2013-02-28 DIAGNOSIS — Z8679 Personal history of other diseases of the circulatory system: Secondary | ICD-10-CM | POA: Insufficient documentation

## 2013-02-28 DIAGNOSIS — J209 Acute bronchitis, unspecified: Secondary | ICD-10-CM | POA: Insufficient documentation

## 2013-02-28 DIAGNOSIS — J189 Pneumonia, unspecified organism: Secondary | ICD-10-CM | POA: Insufficient documentation

## 2013-02-28 DIAGNOSIS — F17200 Nicotine dependence, unspecified, uncomplicated: Secondary | ICD-10-CM | POA: Insufficient documentation

## 2013-02-28 DIAGNOSIS — R062 Wheezing: Secondary | ICD-10-CM | POA: Insufficient documentation

## 2013-02-28 DIAGNOSIS — Z8719 Personal history of other diseases of the digestive system: Secondary | ICD-10-CM | POA: Insufficient documentation

## 2013-02-28 DIAGNOSIS — F319 Bipolar disorder, unspecified: Secondary | ICD-10-CM | POA: Insufficient documentation

## 2013-02-28 DIAGNOSIS — Z8659 Personal history of other mental and behavioral disorders: Secondary | ICD-10-CM | POA: Insufficient documentation

## 2013-02-28 DIAGNOSIS — Z79899 Other long term (current) drug therapy: Secondary | ICD-10-CM | POA: Insufficient documentation

## 2013-02-28 DIAGNOSIS — R059 Cough, unspecified: Secondary | ICD-10-CM | POA: Insufficient documentation

## 2013-02-28 LAB — CBC WITH DIFFERENTIAL/PLATELET
Basophils Absolute: 0 10*3/uL (ref 0.0–0.1)
Eosinophils Absolute: 1 10*3/uL — ABNORMAL HIGH (ref 0.0–0.7)
Lymphocytes Relative: 14 % (ref 12–46)
Monocytes Relative: 12 % (ref 3–12)
Neutrophils Relative %: 66 % (ref 43–77)
Platelets: 156 10*3/uL (ref 150–400)
RDW: 12.4 % (ref 11.5–15.5)
WBC: 12.2 10*3/uL — ABNORMAL HIGH (ref 4.0–10.5)

## 2013-02-28 LAB — PRO B NATRIURETIC PEPTIDE: Pro B Natriuretic peptide (BNP): 41.6 pg/mL (ref 0–125)

## 2013-02-28 LAB — BASIC METABOLIC PANEL
Chloride: 101 mEq/L (ref 96–112)
Creatinine, Ser: 0.7 mg/dL (ref 0.50–1.10)
GFR calc Af Amer: >90 mL/min (ref >90)
GFR calc non Af Amer: >90 mL/min (ref >90)
Potassium: 3.8 mEq/L (ref 3.5–5.1)

## 2013-02-28 MED ORDER — ALBUTEROL SULFATE (2.5 MG/3ML) 0.083% IN NEBU: 2.5000 mg | INHALATION_SOLUTION | RESPIRATORY_TRACT | Status: AC | PRN

## 2013-02-28 MED ORDER — DEXAMETHASONE SODIUM PHOSPHATE 10 MG/ML IJ SOLN
10.0000 mg | Freq: Once | INTRAMUSCULAR | Status: AC
  Administered 2013-02-28: 10 mg via INTRAMUSCULAR
  Filled 2013-02-28: qty 1

## 2013-02-28 MED ORDER — METHYLPREDNISOLONE SODIUM SUCC 125 MG IJ SOLR
125.0000 mg | Freq: Once | INTRAMUSCULAR | Status: DC
  Filled 2013-02-28: qty 2

## 2013-02-28 MED ORDER — ALBUTEROL SULFATE (5 MG/ML) 0.5% IN NEBU
5.0000 mg | INHALATION_SOLUTION | Freq: Once | RESPIRATORY_TRACT | Status: AC
  Administered 2013-02-28: 5 mg via RESPIRATORY_TRACT
  Filled 2013-02-28: qty 1

## 2013-02-28 MED ORDER — LEVOFLOXACIN 750 MG PO TABS: 750.0000 mg | ORAL_TABLET | Freq: Every day | ORAL | Status: DC

## 2013-02-28 MED ORDER — PREDNISONE 20 MG PO TABS: 60.0000 mg | ORAL_TABLET | Freq: Every day | ORAL | Status: DC

## 2013-02-28 NOTE — ED Notes (Signed)
Mother reports  SOb x 3 days

## 2013-02-28 NOTE — ED Notes (Signed)
Patient transported to X-ray 

## 2013-02-28 NOTE — ED Notes (Signed)
MD at bedside. 

## 2013-02-28 NOTE — ED Provider Notes (Addendum)
History  This chart was scribed for Judy Burns, * by Ardeen Jourdain, ED Scribe. This patient was seen in room MH06/MH06 and the patient's care was started at 0038.  CSN: [id number]  Arrival date & time 02/28/13  0027   First MD Initiated Contact with Patient 02/28/13 0038      Chief Complaint  Patient presents with  . Shortness of Breath     The history is provided by the patient. No language interpreter was used.    Judy Burns is a 22 y.o. female who presents to the Emergency Department complaining of gradually worsening, gradual onset, constant SOB that began 5 weeks ago with associated cough. Pt states the symptoms began to worsen 3 days ago. Pts mother states she was evaluated by her PCP for the symptoms and was prescribed an albuterol inhaler and azithromycin. Pts mother states she had a CXR today. Pts mother states the medications have not been relieving the symptoms. Pt denies any fever, nausea and emesis as associated symptoms.     Past Medical History  Diagnosis Date  . Hx of heart surgery   . Tetralogy of Fallot s/p repair   . GERD (gastroesophageal reflux disease)   . Bipolar 1 disorder   . Asperger's syndrome   . PDD (pervasive developmental disorder)     Past Surgical History  Procedure Laterality Date  . Cardiac surgery  1990    History reviewed. No pertinent family history.  History  Substance Use Topics  . Smoking status: Current Every Day Smoker -- 0.50 packs/day    Types: Cigarettes  . Smokeless tobacco: Not on file  . Alcohol Use: No   No OB history available.   Review of Systems  Constitutional: Negative for fever and chills.  Respiratory: Positive for cough, shortness of breath and wheezing.   Gastrointestinal: Negative for nausea and vomiting.  Neurological: Negative for weakness.  All other systems reviewed and are negative.    Allergies  Carrot flavor  Home Medications   Current Outpatient Rx  Name  Route  Sig   Dispense  Refill  . buPROPion (WELLBUTRIN XL) 150 MG 24 hr tablet   Oral   Take 150 mg by mouth daily.         Marland Kitchen FLUoxetine (PROZAC) 20 MG capsule   Oral   Take 3 capsules (60 mg total) by mouth daily.   90 capsule   0   . guanFACINE (INTUNIV) 1 MG TB24   Oral   Take 1 mg by mouth daily.         Marland Kitchen ibuprofen (MOTRIN IB) 200 MG tablet   Oral   Take 3 tablets (600 mg total) by mouth every 6 (six) hours as needed for pain.   30 tablet   0   . QUEtiapine (SEROQUEL) 300 MG tablet   Oral   Take 300 mg by mouth at bedtime.         Marland Kitchen zolpidem (AMBIEN) 5 MG tablet   Oral   Take 5 mg by mouth at bedtime.           Triage Vitals: BP 120/60  Pulse 107  Temp(Src) 99 F (37.2 C)  Resp 20  Ht 5\' 2"  (1.575 m)  Wt 215 lb (97.523 kg)  BMI 39.31 kg/m2  SpO2 97%  Physical Exam  Nursing note and vitals reviewed. Constitutional: She is oriented to person, place, and time. She appears well-developed and well-nourished. No distress.  HENT:  Head: Normocephalic and  atraumatic.  Right Ear: Hearing normal.  Nose: Nose normal.  Mouth/Throat: Oropharynx is clear and moist and mucous membranes are normal.  Eyes: Conjunctivae and EOM are normal. Pupils are equal, round, and reactive to light.  Neck: Normal range of motion. Neck supple.  Cardiovascular: Normal rate, regular rhythm, S1 normal and S2 normal.  Exam reveals no gallop and no friction rub.   No murmur heard. Pulmonary/Chest: Effort normal and breath sounds normal. No respiratory distress. She exhibits no tenderness.  Abdominal: Soft. Normal appearance and bowel sounds are normal. There is no hepatosplenomegaly. There is no tenderness. There is no rebound, no guarding, no tenderness at McBurney's point and negative Murphy's sign. No hernia.  Musculoskeletal: Normal range of motion.  Neurological: She is alert and oriented to person, place, and time. She has normal strength. No cranial nerve deficit or sensory deficit.  Coordination normal. GCS eye subscore is 4. GCS verbal subscore is 5. GCS motor subscore is 6.  Skin: Skin is warm, dry and intact. No rash noted. No cyanosis.  Psychiatric: She has a normal mood and affect. Her speech is normal and behavior is normal. Thought content normal.    ED Course  Procedures (including critical care time)  DIAGNOSTIC STUDIES: Oxygen Saturation is 97% on room air, normal by my interpretation.    COORDINATION OF CARE:  12:45 AM-Discussed treatment plan which includes albuterol breathing treatments with pt at bedside and pt agreed to plan.     Labs Reviewed - No data to display Dg Chest 2 View  02/28/2013  *RADIOLOGY REPORT*  Clinical Data: Shortness of breath; mid chest pain.  CHEST - 2 VIEW  Comparison: Chest radiograph performed 02/27/2013  Findings: The lungs are well-aerated.  Mild bibasilar opacities may reflect mild pneumonia.  There is no evidence of pleural effusion or pneumothorax.  The heart is borderline enlarged; mild vascular congestion is seen. No acute osseous abnormalities are seen.  IMPRESSION:  1.  Mild bibasilar airspace opacities may reflect mild pneumonia. 2.  Borderline cardiomegaly and mild vascular congestion.   Original Report Authenticated By: Tonia Ghent, M.D.      Assessment: Bronchitis with bronchospasm, possible pneumonia    MDM  Presents to the ER with complaints of shortness of breath. She has been treated with antibiotics and bronchodilators over the last 4-5 weeks but continues to have wheezing. Her oxygenation is adequate. Patient did have wheezing on examination which improved with albuterol. Chest x-ray was obtained. Basilar opacities are present. Can't rule out pneumonia, but may be chronic changes secondary to her previous cardiac history and surgery. Workup does not show any evidence of congestive heart failure. This was performed because the patient has a history of tetralogy of flow repair. Patient and family were  reassured. She has followup with her cardiologist this week. Will continue bronchodilators and corticosteroids.    I personally performed the services described in this documentation, which was scribed in my presence. The recorded information has been reviewed and is accurate.      Judy Crease, MD 02/28/13 6578  Judy Crease, MD 02/28/13 [phone]

## 2013-04-30 IMAGING — CR DG CHEST 1V PORT
1 series · 1 of 1 positions shown · non-contrast
Comparison: 11/29/2010

CLINICAL DATA: Assess for pulmonary edema.  Shortness of breath.

PORTABLE CHEST - 1 VIEW

[AP]
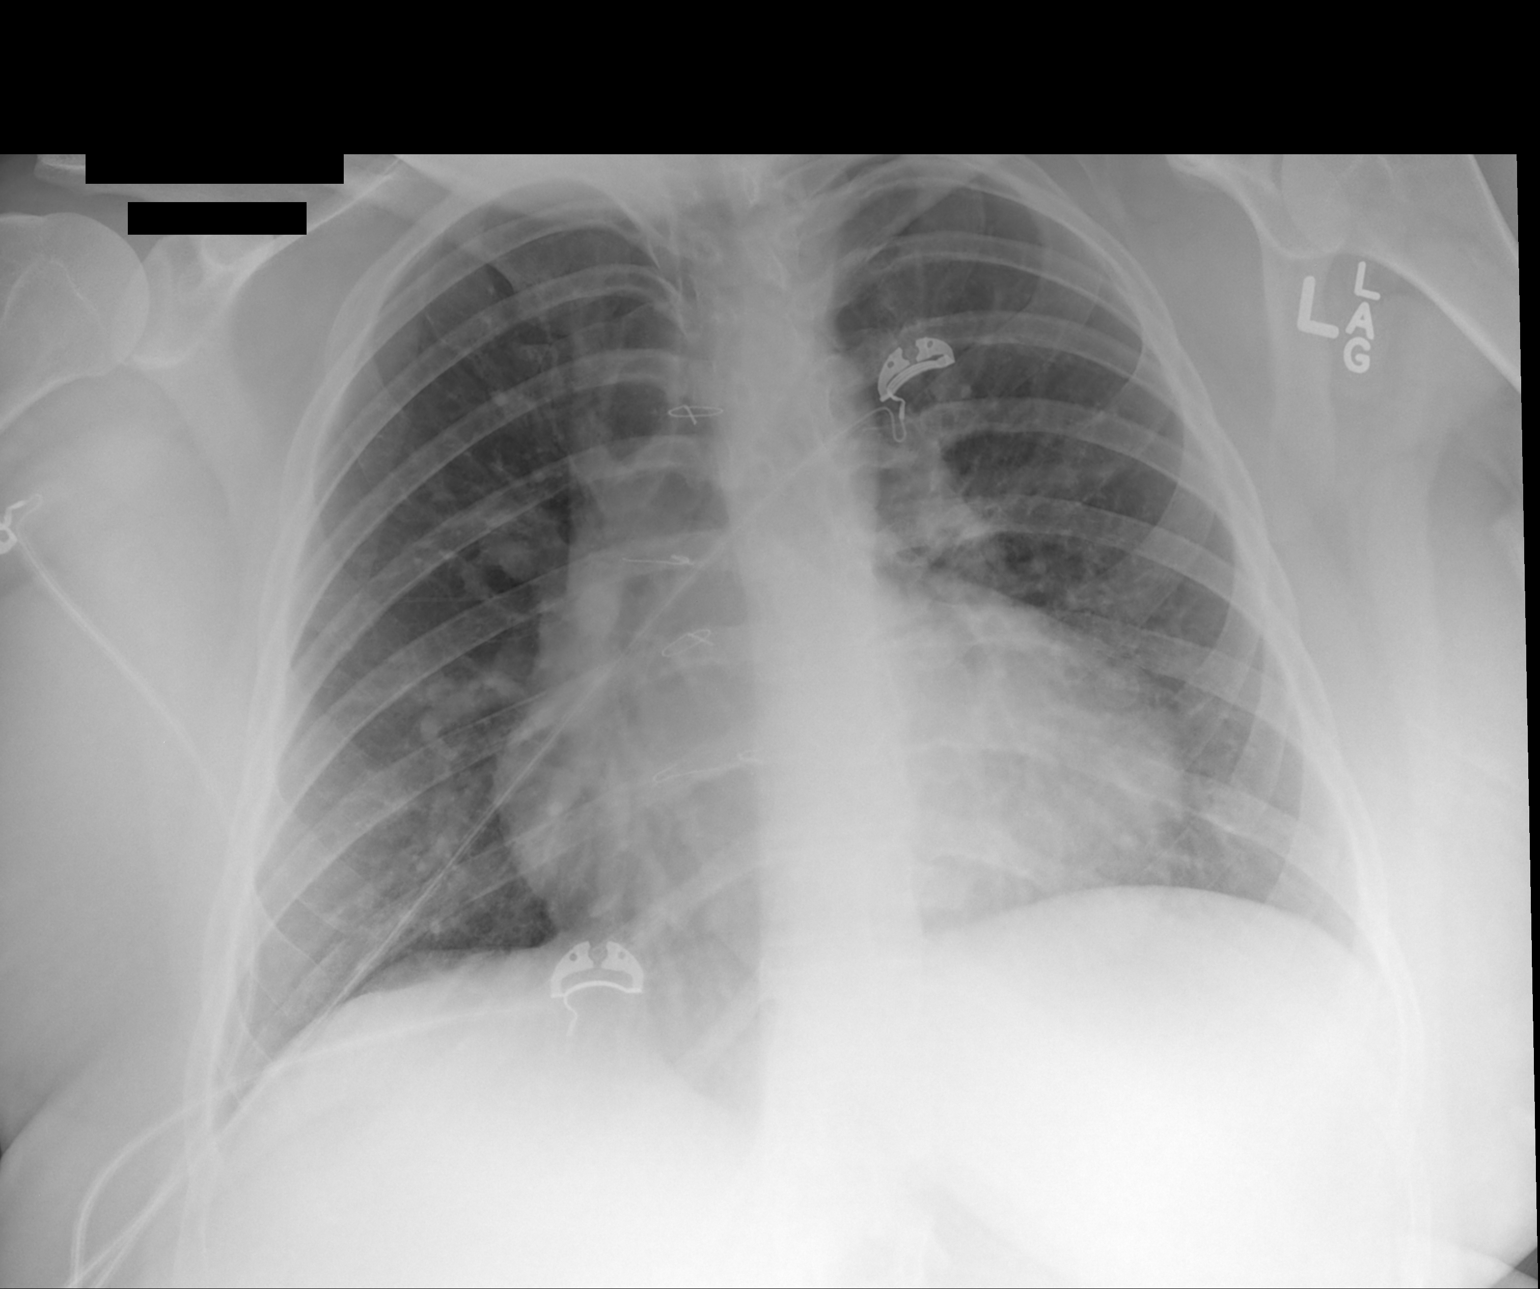

[1 of 1 positions shown; findings below may reference images not displayed]

FINDINGS: Stable mild cardiomegaly.  Prior median sternotomy noted.
Slight pulmonary vascular congestion is present.  No evidence of
edema or focal airspace disease.  No visible pleural effusion.
IMPRESSION: Cardiomegaly with slight pulmonary vascular congestion.

Prior median sternotomy.

## 2013-07-03 ENCOUNTER — Encounter (HOSPITAL_COMMUNITY): Payer: Self-pay

## 2013-07-03 ENCOUNTER — Emergency Department (EMERGENCY_DEPARTMENT_HOSPITAL)
Admission: EM | Admit: 2013-07-03 | Discharge: 2013-07-04 | Disposition: A | Discharge status: Other Acute Inpatient Hospital | Payer: Medicare Other | Source: Home / Self Care | Attending: Emergency Medicine | Admitting: Emergency Medicine

## 2013-07-03 DIAGNOSIS — F17200 Nicotine dependence, unspecified, uncomplicated: Secondary | ICD-10-CM | POA: Insufficient documentation

## 2013-07-03 DIAGNOSIS — Z3202 Encounter for pregnancy test, result negative: Secondary | ICD-10-CM | POA: Insufficient documentation

## 2013-07-03 DIAGNOSIS — F319 Bipolar disorder, unspecified: Secondary | ICD-10-CM | POA: Insufficient documentation

## 2013-07-03 DIAGNOSIS — Z8639 Personal history of other endocrine, nutritional and metabolic disease: Secondary | ICD-10-CM | POA: Insufficient documentation

## 2013-07-03 DIAGNOSIS — Z8719 Personal history of other diseases of the digestive system: Secondary | ICD-10-CM | POA: Insufficient documentation

## 2013-07-03 DIAGNOSIS — F29 Unspecified psychosis not due to a substance or known physiological condition: Secondary | ICD-10-CM

## 2013-07-03 DIAGNOSIS — R45851 Suicidal ideations: Secondary | ICD-10-CM | POA: Insufficient documentation

## 2013-07-03 DIAGNOSIS — Z8659 Personal history of other mental and behavioral disorders: Secondary | ICD-10-CM | POA: Insufficient documentation

## 2013-07-03 DIAGNOSIS — R440 Auditory hallucinations: Secondary | ICD-10-CM

## 2013-07-03 DIAGNOSIS — F062 Psychotic disorder with delusions due to known physiological condition: Secondary | ICD-10-CM

## 2013-07-03 DIAGNOSIS — R443 Hallucinations, unspecified: Secondary | ICD-10-CM | POA: Insufficient documentation

## 2013-07-03 DIAGNOSIS — F3112 Bipolar disorder, current episode manic without psychotic features, moderate: Secondary | ICD-10-CM

## 2013-07-03 DIAGNOSIS — Z862 Personal history of diseases of the blood and blood-forming organs and certain disorders involving the immune mechanism: Secondary | ICD-10-CM | POA: Insufficient documentation

## 2013-07-03 DIAGNOSIS — Z79899 Other long term (current) drug therapy: Secondary | ICD-10-CM | POA: Insufficient documentation

## 2013-07-03 DIAGNOSIS — Z9889 Other specified postprocedural states: Secondary | ICD-10-CM | POA: Insufficient documentation

## 2013-07-03 HISTORY — DX: Di George's syndrome: D82.1

## 2013-07-03 LAB — RAPID URINE DRUG SCREEN, HOSP PERFORMED
Amphetamines: NOT DETECTED
Barbiturates: NOT DETECTED
Benzodiazepines: NOT DETECTED
Cocaine: NOT DETECTED
Tetrahydrocannabinol: NOT DETECTED

## 2013-07-03 LAB — COMPREHENSIVE METABOLIC PANEL
ALT: 12 U/L (ref 0–35)
AST: 11 U/L (ref 0–37)
Alkaline Phosphatase: 105 U/L (ref 39–117)
GFR calc Af Amer: >90 mL/min (ref >90)
Glucose, Bld: 111 mg/dL — ABNORMAL HIGH (ref 70–99)
Potassium: 3.7 mEq/L (ref 3.5–5.1)
Sodium: 135 mEq/L (ref 135–145)
Total Protein: 7.7 g/dL (ref 6.0–8.3)

## 2013-07-03 LAB — CBC
Hemoglobin: 13.6 g/dL (ref 12.0–15.0)
MCHC: 34.2 g/dL (ref 30.0–36.0)
Platelets: 163 10*3/uL (ref 150–400)
RBC: 4.48 MIL/uL (ref 3.87–5.11)

## 2013-07-03 LAB — POCT PREGNANCY, URINE: Preg Test, Ur: NEGATIVE

## 2013-07-03 MED ORDER — LORAZEPAM 1 MG PO TABS: 1.0000 mg | ORAL_TABLET | Freq: Three times a day (TID) | ORAL | Status: DC | PRN

## 2013-07-03 MED ORDER — IBUPROFEN 200 MG PO TABS: 400.0000 mg | ORAL_TABLET | Freq: Three times a day (TID) | ORAL | Status: DC | PRN

## 2013-07-03 MED ORDER — ALUM & MAG HYDROXIDE-SIMETH 200-200-20 MG/5ML PO SUSP: 30.0000 mL | ORAL | Status: DC | PRN

## 2013-07-03 MED ORDER — ACETAMINOPHEN 325 MG PO TABS: 650.0000 mg | ORAL_TABLET | ORAL | Status: DC | PRN

## 2013-07-03 MED ORDER — QUETIAPINE FUMARATE 300 MG PO TABS
300.0000 mg | ORAL_TABLET | Freq: Every day | ORAL | Status: DC
  Administered 2013-07-03: 300 mg via ORAL
  Filled 2013-07-03: qty 1

## 2013-07-03 MED ORDER — FLUOXETINE HCL 20 MG PO CAPS
60.0000 mg | ORAL_CAPSULE | Freq: Every day | ORAL | Status: DC
  Administered 2013-07-03 – 2013-07-04 (×2): 60 mg via ORAL
  Filled 2013-07-03 (×2): qty 3

## 2013-07-03 MED ORDER — ALBUTEROL SULFATE (5 MG/ML) 0.5% IN NEBU: 2.5000 mg | INHALATION_SOLUTION | RESPIRATORY_TRACT | Status: DC | PRN

## 2013-07-03 MED ORDER — ZOLPIDEM TARTRATE 5 MG PO TABS
5.0000 mg | ORAL_TABLET | Freq: Every day | ORAL | Status: DC
  Administered 2013-07-03: 5 mg via ORAL
  Filled 2013-07-03: qty 1

## 2013-07-03 MED ORDER — GUANFACINE HCL ER 1 MG PO TB24
1.0000 mg | ORAL_TABLET | Freq: Every day | ORAL | Status: DC
  Administered 2013-07-03 – 2013-07-04 (×2): 1 mg via ORAL
  Filled 2013-07-03 (×2): qty 1

## 2013-07-03 MED ORDER — NICOTINE 21 MG/24HR TD PT24
21.0000 mg | MEDICATED_PATCH | Freq: Every day | TRANSDERMAL | Status: DC | PRN
  Administered 2013-07-03: 21 mg via TRANSDERMAL
  Filled 2013-07-03: qty 1

## 2013-07-03 NOTE — ED Notes (Addendum)
Patients clothes were sent home w/mother from the regular ED.

## 2013-07-03 NOTE — ED Provider Notes (Signed)
CSN: [id number]     Arrival date & time 07/03/13  1316 History     First MD Initiated Contact with Patient 07/03/13 1343     Chief Complaint  Patient presents with  . Medical Clearance  . Hallucinations  . Suicidal    HPI Pt was seen at 1345. Per pt and her mother, c/o gradual onset and worsening of persistent SI for the past 4 to 5 days. Has been associated with auditory hallucinations telling her that "being alive is worse for you than being dead." Pt texted her mother a picture of a person holding a gun to their head with a caption of "this is how I feel."  Pt's mother states pt "gets like this when a relationship goes bad," which apparently happened last week. Denies SA, no HI.     Past Medical History  Diagnosis Date  . Hx of heart surgery   . Tetralogy of Fallot s/p repair   . GERD (gastroesophageal reflux disease)   . Bipolar 1 disorder   . Asperger's syndrome   . PDD (pervasive developmental disorder)   . DiGeorge syndrome    Past Surgical History  Procedure Laterality Date  . Cardiac surgery  1990    History  Substance Use Topics  . Smoking status: Current Every Day Smoker -- 0.50 packs/day    Types: Cigarettes  . Smokeless tobacco: Not on file  . Alcohol Use: No    Review of Systems ROS: Statement: All systems negative except as marked or noted in the HPI; Constitutional: Negative for fever and chills. ; ; Eyes: Negative for eye pain, redness and discharge. ; ; ENMT: Negative for ear pain, hoarseness, nasal congestion, sinus pressure and sore throat. ; ; Cardiovascular: Negative for chest pain, palpitations, diaphoresis, dyspnea and peripheral edema. ; ; Respiratory: Negative for cough, wheezing and stridor. ; ; Gastrointestinal: Negative for nausea, vomiting, diarrhea, abdominal pain, blood in stool, hematemesis, jaundice and rectal bleeding. . ; ; Genitourinary: Negative for dysuria, flank pain and hematuria. ; ; Musculoskeletal: Negative for back pain and neck  pain. Negative for swelling and trauma.; ; Skin: Negative for pruritus, rash, abrasions, blisters, bruising and skin lesion.; ; Neuro: Negative for headache, lightheadedness and neck stiffness. Negative for weakness, altered level of consciousness , altered mental status, extremity weakness, paresthesias, involuntary movement, seizure and syncope.; Psych:  +SI, no SA, no HI, +auditory hallucinations.      Allergies  Carrot flavor  Home Medications   Current Outpatient Rx  Name  Route  Sig  Dispense  Refill  . albuterol (PROVENTIL) (2.5 MG/3ML) 0.083% nebulizer solution   Nebulization   Take 3 mLs (2.5 mg total) by nebulization every 4 (four) hours as needed for wheezing.   30 vial   2   . FLUoxetine (PROZAC) 20 MG capsule   Oral   Take 3 capsules (60 mg total) by mouth daily.   90 capsule   0   . guanFACINE (INTUNIV) 1 MG TB24   Oral   Take 1 mg by mouth daily.         Marland Kitchen ibuprofen (ADVIL,MOTRIN) 200 MG tablet   Oral   Take 600 mg by mouth every 8 (eight) hours as needed for pain.         Marland Kitchen QUEtiapine (SEROQUEL) 300 MG tablet   Oral   Take 300 mg by mouth at bedtime.         Marland Kitchen zolpidem (AMBIEN) 5 MG tablet   Oral  Take 5 mg by mouth at bedtime.          BP 143/80  Pulse 87  Temp(Src) 99.4 F (37.4 C) (Oral)  Resp 20  SpO2 97% Physical Exam 1350: Physical examination:  Nursing notes reviewed; Vital signs and O2 SAT reviewed;  Constitutional: Well developed, Well nourished, Well hydrated, In no acute distress; Head:  Normocephalic, atraumatic; Eyes: EOMI, PERRL, No scleral icterus; ENMT: Mouth and pharynx normal, Mucous membranes moist; Neck: Supple, Full range of motion, No lymphadenopathy; Cardiovascular: Regular rate and rhythm, No murmur, rub, or gallop; Respiratory: Breath sounds clear & equal bilaterally, No rales, rhonchi, wheezes.  Speaking full sentences with ease, Normal respiratory effort/excursion; Chest: Nontender, Movement normal; Extremities:  Pulses normal, No tenderness, No edema, No calf edema or asymmetry.; Neuro: AA&Ox3, Major CN grossly intact.  Speech clear. Climbs on and off chair in room easily. Walking around the hallways with steady gait. No gross focal motor or sensory deficits in extremities.; Skin: Color normal, Warm, Dry.; Psych:  Affect flat, poor eye contact. +SI, +auditory hallucinations.     ED Course   Procedures   MDM  MDM Reviewed: previous chart, nursing note and vitals Interpretation: labs   Results for orders placed during the hospital encounter of 07/03/13  CBC      Result Value Range   WBC 11.9 (*) 4.0 - 10.5 K/uL   RBC 4.48  3.87 - 5.11 MIL/uL   Hemoglobin 13.6  12.0 - 15.0 g/dL   HCT 16.1  09.6 - 04.5 %   MCV 88.8  78.0 - 100.0 fL   MCH 30.4  26.0 - 34.0 pg   MCHC 34.2  30.0 - 36.0 g/dL   RDW 40.9  81.1 - 91.4 %   Platelets 163  150 - 400 K/uL  COMPREHENSIVE METABOLIC PANEL      Result Value Range   Sodium 135  135 - 145 mEq/L   Potassium 3.7  3.5 - 5.1 mEq/L   Chloride 101  96 - 112 mEq/L   CO2 25  19 - 32 mEq/L   Glucose, Bld 111 (*) 70 - 99 mg/dL   BUN 10  6 - 23 mg/dL   Creatinine, Ser 7.82  0.50 - 1.10 mg/dL   Calcium 9.4  8.4 - 95.6 mg/dL   Total Protein 7.7  6.0 - 8.3 g/dL   Albumin 3.8  3.5 - 5.2 g/dL   AST 11  0 - 37 U/L   ALT 12  0 - 35 U/L   Alkaline Phosphatase 105  39 - 117 U/L   Total Bilirubin 0.3  0.3 - 1.2 mg/dL   GFR calc non Af Amer >90  >90 mL/min   GFR calc Af Amer >90  >90 mL/min  ETHANOL      Result Value Range   Alcohol, Ethyl (B) <11  0 - 11 mg/dL  URINE RAPID DRUG SCREEN (HOSP PERFORMED)      Result Value Range   Opiates NONE DETECTED  NONE DETECTED   Cocaine NONE DETECTED  NONE DETECTED   Benzodiazepines NONE DETECTED  NONE DETECTED   Amphetamines NONE DETECTED  NONE DETECTED   Tetrahydrocannabinol NONE DETECTED  NONE DETECTED   Barbiturates NONE DETECTED  NONE DETECTED  POCT PREGNANCY, URINE      Result Value Range   Preg Test, Ur NEGATIVE   NEGATIVE     1530:  ACT to eval. Holding orders written.     Laray Anger, DO 07/03/13 1531

## 2013-07-03 NOTE — BH Assessment (Signed)
Assessment Note  Judy Burns is an 22 y.o. female who presents as sad but cooperative. Pt currently lives with her mother and return.    Pt reports that she hears a woman's voice in her head talking to her. Pt reports that the woman says "If you are unhappy you should do something about it.  There is more danger on Earth than there is in being dead".  Pt reports that voice tells her, "Make yourself happy.  Follow through".  Pt denies that voice tells her how to follow through.  Pt report that she has no current plan, "Because nothing I have tried has worked".  Pt reports that she can not remember how many times she has attempted suicide.  "I have tried different things like hanging myself, drowning myself, and taking pills, but nothing worked".  Pt reports that she sent her mother a text with someone with a gun pointed to their head and a message saying "this is how I feel".  Pt reports "sometimes I don't know if I'm dead or alive.. If I'm already dead, I wish someone would tell me then this could all be over".   Pt reports that when she goes to sleep, "I travel to the [place]... Its where people are dead already and its dark and I can hear people and sometimes a piano and sometimes a clock".  Pt reports, "Sometimes I have trouble telling what's real or when I'm asleep or awake".   Pt has a history of cutting herself and scratching herself in order to see blood.   Pt denies HI.  Pt reports "I don't know what I feel ... I don't want to feel nothing".  Pt reports increased appetite and wt gain of 25 pounds.  Pt reports decreased sleeping.  Pt reports not wanting to get up out of bed and isolating herself from friends and family.  Pt continuously asked CSW if some could give her "something to knock me out like a shot or some medicine.. I just don't want to feel anything".  Pt reports that she see Dr. Starling Manns at [facility] for med management.   She has on-going therapy with Gavin Pound at the [facility].  Pt reports that she has been hospitalized for mental health at least 20 times.  She thinks that the last time was somewhere in [place].  Pt reports that she doesn't know what her diagnosis are.  She reports that she takes Seroquel, Intuniv, Fluoxetine, and Ambien. Pt reports that she is compliant with with her medicines.  Axis I: Asperger's Disorder, Depressive Disorder NOS and Mood Disorder NOS Axis II: Deferred Axis III:  Past Medical History  Diagnosis Date  . Hx of heart surgery   . Tetralogy of Fallot s/p repair   . GERD (gastroesophageal reflux disease)   . Bipolar 1 disorder   . Asperger's syndrome   . PDD (pervasive developmental disorder)   . DiGeorge syndrome    Axis IV: other psychosocial or environmental problems Axis V: 21-30 behavior considerably influenced by delusions or hallucinations OR serious impairment in judgment, communication OR inability to function in almost all areas  Past Medical History:  Past Medical History  Diagnosis Date  . Hx of heart surgery   . Tetralogy of Fallot s/p repair   . GERD (gastroesophageal reflux disease)   . Bipolar 1 disorder   . Asperger's syndrome   . PDD (pervasive developmental disorder)   . DiGeorge syndrome  Past Surgical History  Procedure Laterality Date  . Cardiac surgery  1990    Family History: No family history on file.  Social History:  reports that she has been smoking Cigarettes.  She has been smoking about 0.50 packs per day. She does not have any smokeless tobacco history on file. She reports that she does not drink alcohol or use illicit drugs.  Additional Social History:     CIWA: CIWA-Ar BP: 103/68 mmHg Pulse Rate: 75 COWS:    Allergies:  Allergies  Allergen Reactions  . Carrot Flavor (Flavoring Agent) Swelling    Home Medications:  (Not in a hospital admission)  OB/GYN Status:  No LMP recorded. Patient is not currently having periods (Reason: IUD).  General  Assessment Data Location of Assessment: [facility] ED Is this a Tele or Face-to-Face Assessment?: Face-to-Face Is this an Initial Assessment or a Re-assessment for this encounter?: Initial Assessment Living Arrangements: Parent Can pt return to current living arrangement?: Yes Admission Status: Voluntary Is patient capable of signing voluntary admission?: Yes Transfer from: Home Referral Source: Self/Family/Friend     Risk to self Suicidal Ideation: Yes-Currently Present Suicidal Intent: Yes-Currently Present Is patient at risk for suicide?: Yes Suicidal Plan?: No-Not Currently/Within Last 6 Months (Pt states she doesnt know what to do because nothing worked) Access to [employer]: Yes Specify Access to Suicidal Means:  (pt hx of trying pills/hanging/drowning) What has been your use of drugs/alcohol within the last 12 months?:  (Every once in awhile a beer) Previous Attempts/Gestures: Yes How many times?: 7 (Pt is unsure of number of times exactly) Other Self Harm Risks:  (Pt has hx of cutting and scratching to see blood) Triggers for Past Attempts: Other personal contacts;Hallucinations;Unpredictable Intentional Self Injurious Behavior: Cutting Comment - Self Injurious Behavior:  (Pt has hx of cutting and scratching) Family Suicide History: Unknown Recent stressful life event(s): Conflict (Comment) Persecutory voices/beliefs?: Yes Depression: Yes Depression Symptoms: Insomnia;Isolating;Fatigue;Feeling worthless/self pity Substance abuse history and/or treatment for substance abuse?: No  Risk to Others Homicidal Ideation: No Thoughts of Harm to Others: No Current Homicidal Intent: No Current Homicidal Plan: No Access to Homicidal Means: No Identified Victim: none History of harm to others?: No Assessment of Violence: None Noted Does patient have access to weapons?: No Criminal Charges Pending?: No Does patient have a court date: No  Psychosis Hallucinations: Auditory;With  command Delusions: Unspecified  Mental Status Report Appear/Hygiene: Disheveled Eye Contact: Fair Motor Activity: Freedom of movement Speech: Logical/coherent Level of Consciousness: Alert;Restless Mood: Depressed;Labile;Empty Affect: Depressed;Sad Anxiety Level: Minimal Thought Processes: Coherent;Relevant Judgement: Impaired Orientation: Person;Place;Time;Situation Obsessive Compulsive Thoughts/Behaviors: None  Cognitive Functioning Concentration: Normal Memory: Recent Intact;Remote Intact IQ: Average Insight: Poor Impulse Control: Poor Appetite: Fair Weight Loss: 0 Weight Gain: 25 Sleep: Decreased Total Hours of Sleep: 1 Vegetative Symptoms: Staying in bed  ADLScreening [facility] Assessment Services) Patient's cognitive ability adequate to safely complete daily activities?: Yes Patient able to express need for assistance with ADLs?: Yes Independently performs ADLs?: Yes (appropriate for developmental age)  Prior Inpatient Therapy Prior Inpatient Therapy: Yes Prior Therapy Dates:  (Pt Unsure of dates/ Pt reportsHospitalized at least 20 times) Prior Therapy Facilty/Provider(s):  (Pt states many.  Last time in [place]) Reason for Treatment: Depression  Prior Outpatient Therapy Prior Outpatient Therapy: Yes Prior Therapy Dates: ongoing Prior Therapy Facilty/Provider(s):  ([facility] Reason for Treatment:  (Depression)  ADL Screening (condition at time of admission) Patient's cognitive ability adequate to safely complete daily activities?: Yes  Patient able to express need for assistance with ADLs?: Yes Independently performs ADLs?: Yes (appropriate for developmental age)         Values / Beliefs Cultural Requests During Hospitalization: None Spiritual Requests During Hospitalization: None        Additional Information 1:1 In Past 12 Months?: No CIRT Risk: No Elopement Risk: No Does patient have medical  clearance?: Yes     Disposition:  Disposition Initial Assessment Completed for this Encounter: Yes Disposition of Patient: Inpatient treatment program;Referred to (Disposition pending evaluation by midlevel or psychiatrist )  On Site Evaluation by:   Reviewed with Physician:    Lexine Baton 07/03/2013 6:53 PM

## 2013-07-03 NOTE — ED Notes (Signed)
Pt denies HI/VH.  Pt endorses SI/AH.  Pt states that 3 days ago her boyfriend broke up with her and is now dating her best friend.  Pt states that after the breakup she began to hear a female voice telling her to kill herself.  She states that the female voice has told her that it is a trick to stay on this earth because she will suffer more alive than dead.  Pt states that she believes that the voice is correct because if she's dead she will will have no more suffering.  Pt also states that she wants to see how her ex boyfriend and her ex best friend will act if she dies.  She states "When you die, you get to stay on earth for 2-3 days to say goodbye to everyone.  This is when I will get to see how they will respond to my death."

## 2013-07-03 NOTE — ED Notes (Signed)
Per mom pt has extensive psych hx, state pt has increased hallucination since Friday, AH telling her to kill herself; pt cooperative at this time

## 2013-07-04 ENCOUNTER — Encounter (HOSPITAL_COMMUNITY): Payer: Self-pay | Admitting: Registered Nurse

## 2013-07-04 ENCOUNTER — Encounter (HOSPITAL_COMMUNITY): Payer: Self-pay | Admitting: *Deleted

## 2013-07-04 ENCOUNTER — Inpatient Hospital Stay (HOSPITAL_COMMUNITY)
Admission: AD | Admit: 2013-07-04 | Discharge: 2013-07-10 | DRG: 885 | Disposition: A | Discharge status: Home / Self Care | Payer: Medicare Other | Source: Intra-hospital | Attending: Psychiatry | Admitting: Psychiatry

## 2013-07-04 DIAGNOSIS — K219 Gastro-esophageal reflux disease without esophagitis: Secondary | ICD-10-CM | POA: Diagnosis present

## 2013-07-04 DIAGNOSIS — F411 Generalized anxiety disorder: Secondary | ICD-10-CM | POA: Diagnosis present

## 2013-07-04 DIAGNOSIS — R45851 Suicidal ideations: Secondary | ICD-10-CM

## 2013-07-04 DIAGNOSIS — F848 Other pervasive developmental disorders: Secondary | ICD-10-CM | POA: Diagnosis present

## 2013-07-04 DIAGNOSIS — F314 Bipolar disorder, current episode depressed, severe, without psychotic features: Secondary | ICD-10-CM

## 2013-07-04 DIAGNOSIS — F319 Bipolar disorder, unspecified: Secondary | ICD-10-CM | POA: Diagnosis present

## 2013-07-04 DIAGNOSIS — F3110 Bipolar disorder, current episode manic without psychotic features, unspecified: Secondary | ICD-10-CM

## 2013-07-04 DIAGNOSIS — Z79899 Other long term (current) drug therapy: Secondary | ICD-10-CM

## 2013-07-04 DIAGNOSIS — D821 Di George's syndrome: Secondary | ICD-10-CM | POA: Diagnosis present

## 2013-07-04 DIAGNOSIS — F3130 Bipolar disorder, current episode depressed, mild or moderate severity, unspecified: Principal | ICD-10-CM | POA: Diagnosis present

## 2013-07-04 HISTORY — DX: Anxiety disorder, unspecified: F41.9

## 2013-07-04 HISTORY — DX: Major depressive disorder, single episode, unspecified: F32.9

## 2013-07-04 HISTORY — DX: Depression, unspecified: F32.A

## 2013-07-04 MED ORDER — MAGNESIUM HYDROXIDE 400 MG/5ML PO SUSP: 30.0000 mL | Freq: Every day | ORAL | Status: DC | PRN

## 2013-07-04 MED ORDER — ALUM & MAG HYDROXIDE-SIMETH 200-200-20 MG/5ML PO SUSP
30.0000 mL | ORAL | Status: DC | PRN
  Administered 2013-07-07: 30 mL via ORAL

## 2013-07-04 MED ORDER — ACETAMINOPHEN 325 MG PO TABS
650.0000 mg | ORAL_TABLET | Freq: Four times a day (QID) | ORAL | Status: DC | PRN
  Administered 2013-07-07: 650 mg via ORAL

## 2013-07-04 MED ORDER — TRAZODONE HCL 50 MG PO TABS
50.0000 mg | ORAL_TABLET | Freq: Every evening | ORAL | Status: DC | PRN
  Administered 2013-07-04: 50 mg via ORAL
  Filled 2013-07-04 (×5): qty 1

## 2013-07-04 MED ORDER — ZIPRASIDONE HCL 20 MG PO CAPS
20.0000 mg | ORAL_CAPSULE | Freq: Two times a day (BID) | ORAL | Status: DC
  Administered 2013-07-04 – 2013-07-05 (×2): 20 mg via ORAL
  Filled 2013-07-04 (×5): qty 1

## 2013-07-04 MED ORDER — NICOTINE POLACRILEX 2 MG MT GUM
2.0000 mg | CHEWING_GUM | OROMUCOSAL | Status: DC | PRN
  Administered 2013-07-05 – 2013-07-09 (×14): 2 mg via ORAL

## 2013-07-04 NOTE — Consult Note (Signed)
Seen and agreed. Joshva Labreck, MD 

## 2013-07-04 NOTE — BH Assessment (Signed)
[facility] Assessment Progress Note  Per Thurman Coyer, RN, Administrative Coordinator, pt assigned to Rm [id number] to the service of Thedore Mins, MD.  Doylene Canning, MA Triage Specialist 07/04/2013 @ 16:12

## 2013-07-04 NOTE — Progress Notes (Signed)
D: Patient in day room at the beginning of the shift. She requested to speak with the Clinical research associate in private. She endorsed audio/visual hallucinations. She reported that she hears and see a woman and the woman tells her there is nothing to live for and  life is fake. She said the woman visits her all the time and tells her to come with her. Patient stated; "Prudy Feeler is my father, my daddy dedicated me to him when I was 22 years old and I smoked and did other things". She appeared very scared and worried. Writer asked patient if she would like to talked to Chaplain; "Who is chaplain, would he help me get to the woman". She said sometimes the woman would hold a gun to her head; stand besides her bed and she sometimes travel with the woman in her sleep. She asked if she could get an injection, Valium or something strong; "three times my regular dose". Patient very polite and cooperative.  A: Writer encouraged and supported patient, notify PA of patient symptoms and asked him to start order something to help alleviate the symptoms. PA ordered one time dose of Geodon and encouraged writer to give it with her HS dose of Trazodone.  Administered HS medications as ordered. R: Patient received medications without difficulty. Q 15 minute check continues as ordered to maintain safety

## 2013-07-04 NOTE — Tx Team (Signed)
Initial Interdisciplinary Treatment Plan  PATIENT STRENGTHS: (choose at least two) Communication skills Motivation for treatment/growth Physical Health Supportive family/friends  PATIENT STRESSORS: Health problems Medication change or noncompliance Occupational concerns   PROBLEM LIST: Problem List/Patient Goals Date to be addressed Date deferred Reason deferred Estimated date of resolution  Suicidal ideation 07/04/2013   D/c        depression 07/04/2013   D/c        anxiety 08/04/2013   D/c                           DISCHARGE CRITERIA:  Ability to meet basic life and health needs Improved stabilization in mood, thinking, and/or behavior Motivation to continue treatment in a less acute level of care Need for constant or close observation no longer present Reduction of life-threatening or endangering symptoms to within safe limits Safe-care adequate arrangements made Verbal commitment to aftercare and medication compliance  PRELIMINARY DISCHARGE PLAN: Attend aftercare/continuing care group Outpatient therapy Return to previous living arrangement Return to previous work or school arrangements  PATIENT/FAMIILY INVOLVEMENT: This treatment plan has been presented to and reviewed with the patient, Judy Burns.  The patient and family have been given the opportunity to ask questions and make suggestions.  Earline Mayotte 07/04/2013, 6:59 PM
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
## 2013-07-05 DIAGNOSIS — F314 Bipolar disorder, current episode depressed, severe, without psychotic features: Secondary | ICD-10-CM

## 2013-07-05 MED ORDER — FLUOXETINE HCL 20 MG PO CAPS
20.0000 mg | ORAL_CAPSULE | Freq: Every day | ORAL | Status: DC
  Administered 2013-07-05 – 2013-07-10 (×6): 20 mg via ORAL
  Filled 2013-07-05 (×8): qty 1
  Filled 2013-07-05: qty 3

## 2013-07-05 MED ORDER — TRAZODONE HCL 100 MG PO TABS
100.0000 mg | ORAL_TABLET | Freq: Every evening | ORAL | Status: DC | PRN
  Administered 2013-07-05 – 2013-07-08 (×7): 100 mg via ORAL
  Filled 2013-07-05 (×14): qty 1

## 2013-07-05 MED ORDER — KETOCONAZOLE 2 % EX CREA
TOPICAL_CREAM | Freq: Two times a day (BID) | CUTANEOUS | Status: DC | PRN
  Administered 2013-07-05: 22:00:00 via TOPICAL
  Filled 2013-07-05 (×2): qty 15

## 2013-07-05 MED ORDER — LURASIDONE HCL 40 MG PO TABS
40.0000 mg | ORAL_TABLET | Freq: Every day | ORAL | Status: DC
  Administered 2013-07-05 – 2013-07-06 (×2): 40 mg via ORAL
  Filled 2013-07-05 (×3): qty 1
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
## 2013-07-05 NOTE — Progress Notes (Signed)
D: Pt denies SI/HI. Pt endorses AVH. Pt reports seeing a woman that's telling here to kill herself. Pt reports poor sleep last night and stated she wants a "heavier" med to help her sleep. Pt stated that she has had prior SI attempts. Pt is compliant with treatment and attends groups. A: Medications administered as ordered per MD. Verbal support given. Pt encouraged to attend groups. 15 minute checks performed for safety. R: Pt receptive to treatment.

## 2013-07-05 NOTE — [facility] Group Notes (Signed)
Adult Psychoeducational Group Note  Date:  07/05/2013 Time:  3:54 AM  Group Topic/Focus:  Wrap-Up Group:   The focus of this group is to help patients review their daily goal of treatment and discuss progress on daily workbooks.  Participation Level:  Minimal  Participation Quality:  Appropriate  Affect:  Appropriate  Cognitive:  Alert  Insight: Limited  Engagement in Group:  Limited  Modes of Intervention:  Discussion  Additional Comments:  Judy Burns was talking about her ex-boyfriend cheating on her with her best friend.  She also stated she hears voices telling her she's not safe alive.  She said she has been hearing the voices for the past few days.  Lastly, she stated she hopes she gets a good nights sleep.  Caroll Rancher A 07/05/2013, 3:54 AM

## 2013-07-05 NOTE — [facility] Group Notes (Signed)
[facility] LCSW Aftercare Discharge Planning Group Note   07/05/2013 7:56 AM  Participation Quality:  Engaged  Mood/Affect:  Appropriate  Depression Rating:  7  Anxiety Rating:  7  Thoughts of Suicide:  No Will you contract for safety?   NA  Current AVH:  Yes  Plan for Discharge/Comments:  Judy Burns lives with her mother and is here because "I am not right in my head."  Also c/o not sleeping well.  Sees a vision [figure named Patience] and hears voices.  Admits to previous hospitalizations and living in group homes.  Follows up at [facility] and [facility].  Stressor of boyfriend taking up with her best friend.  Transportation Means: mother  Supports: mother and friends  [place], Thereasa Distance B

## 2013-07-05 NOTE — Progress Notes (Signed)
Recreation Therapy Notes   Date: 08.13.2014 Time: 9:30am Location: 400 Hall Dayroom  Group Topic: Leisure Education  Goal Area(s) Addresses:  Patient will verbalize activity of interest by end of group session. Patient will verbalize the ability to use positive leisure/recreation as a coping mechanism.  Behavioral Response: Engaged, Appropriate  Intervention: Adapted Game  Activity: Letters of Leisure. Patients were asked to select a card with a letter of the alphabet from LRT. Patients were asked to state a leisure/recreation activity of choice to correspond with the letter chosen. After each patient had chosen a card and identified an activity group members were asked to identify letters not selected individually.   Education:  Leisure Education, Pharmacologist, Discharge Planning  Education Outcome: Acknowledges understanding  Clinical Observations/Feedback:  Patient actively engaged in group session. Patient spontaneously contributed to opening discussion defining leisure for group. Patient participated in activity, selecting a card and identifying an appropriate activity. Additionally, patient spontaneously contributed activities to group list. Patient contributed to wrap up discussion, making a connection between using leisure/recreation to help make relationships stronger. Patient additionally identified places where you can participate in leisure/recreation, equipment needed, as well as non-tangible items needed to participate in leisure/recreation.   Marykay Lex Rhythm [place], LRT/CTRS  Adya Wirz L 07/05/2013 1:16 PM

## 2013-07-05 NOTE — [facility] Group Notes (Signed)
[facility] Group Therapy  07/05/2013 , 2:13 PM    Type of Therapy:  [facility] [employer] Presentation  Participation Level:  Active  Participation Quality:  Attentive  Affect:  Blunted  Cognitive:  Oriented  Insight:  Limited  Engagement in Therapy:  Engaged  Modes of Intervention:  Discussion, Education and Socialization  Summary of Progress/Problems:  Onalee Hua from [facility] [employer] came to present his recovery story and play the guitar.  Wanted to ask many questions initially, but redirectable.  Enjoyed engaging the  [employer], and appreciated the music.  Daryel Gerald B 07/05/2013 , 2:13 PM

## 2013-07-05 NOTE — Progress Notes (Signed)
Pt (and her mom) is reporting the replaying thoughts of horror movies in her head. She denies the thoughts of having any homicidal or suicidal urges because of these thoughts. Pt is contracting for safety. Pt complained of itching of her left breast. Breast was reddened where the pt was scratching herself. PA notified. Antifungal cream ordered. Pt reported relief with the application of the cream. Overall pt was observed interacting appropriately within the milieu.  A: Writer administered scheduled and prn medications to pt. Continued support and availability as needed was extended to this pt. Staff continue to monitor pt with q62min checks.  R: No adverse drug reactions noted. Pt receptive to treatment. Pt remains safe at this time.
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
## 2013-07-05 NOTE — Progress Notes (Signed)
Chaplain responded to spiritual care consult.  Provided support with pt on 400 hall.    Pt expressed frustration around relationship with mother.  Pt feels she does not have any autonomy and cannot make decisions for herself.  Spoke of feeling trapped and exacerbated that she is now not able to return to a program which she feels has been helpful for her.  Stated she does not know how to speak with her mother.  Chaplain provided emotional support around frustrations and encouraged pt to find and participate in activities that promote her peace and rest immediately, encouraged exploring ways to communicated differently with mother during time at [facility].  Pt spoke of visual hallucinations of woman telling her that she would feel better if she were deceased.  Spoke of the pain she feels and how she longs for change.   Pt requested lined paper, which chaplain printed from unit printer and delivered.    Belva Crome MDiv

## 2013-07-06 DIAGNOSIS — F3130 Bipolar disorder, current episode depressed, mild or moderate severity, unspecified: Principal | ICD-10-CM

## 2013-07-06 MED ORDER — HYDROXYZINE HCL 25 MG PO TABS
25.0000 mg | ORAL_TABLET | Freq: Four times a day (QID) | ORAL | Status: DC | PRN
  Administered 2013-07-06 – 2013-07-09 (×3): 25 mg via ORAL
  Filled 2013-07-06: qty 10

## 2013-07-06 NOTE — Progress Notes (Signed)
Patient resting quietly with eyes closed. Respirations even and unlabored, no distress noted. Q 15 minutes check continues as scheduled to maintain safety.  

## 2013-07-06 NOTE — Progress Notes (Addendum)
D:  Patient up and present in the milieu today.  She has attended and participated in all groups.  She is tolerating her medications well.  States she did not sleep very well last night, that she "saw a movie in her head" all night.  She rates her depression at 3 and hopelessness at 20 on a 1-10 scale.  She denies having any suicidal thoughts at this time.  Patient came to the window stating she was short of breath and needed an inhaler.   A:  Medications given as prescribed.  When patient complained of feeling short of breath she was also very clammy and looked quite anxious.  Her breath sounds were clear to the bases and O2 saturation was at 99%.  Discussed case with L. Earlene Plater, NP who gave orders for hydroxyzine 25 mg PO PRN.  Patient was given a dose and reassured that the medicine would work quickly and that her lungs were fine.  She was encouraged to engage in a relaxing activity while the medicine got into her system.   R:  Pleasant and cooperative with staff.  Denies hearing voices at this time.  Patient is experiencing some anxiety.  We will continue to monitor her and offer reassurance.  Interacting well with peers.  Patient is agreeable to plan of care at this time.  Safety is maintained with 15 minute checks.

## 2013-07-06 NOTE — Progress Notes (Signed)
[facility] MD Progress Note  07/06/2013 3:48 PM Judy Burns  MRN:  [id number] Subjective:   Patient states "I'm doing better. I've accepted that I don't have my boyfriend anymore. I am learning coping skills to overcome things. I want to know when I can leave here. Can you refer me to a dental person. I'm concerned about my teeth. I have not heard Precious's voice since yesterday." Patient feels like her new medication regimen is helping her symptoms.   Objective:  Patient shows little insight into symptoms and is childlike in her interactions with staff.   Diagnosis:   Axis I: Bipolar, Depressed Axis II: Deferred Axis III:  Past Medical History  Diagnosis Date  . Hx of heart surgery   . Tetralogy of Fallot s/p repair   . GERD (gastroesophageal reflux disease)   . Bipolar 1 disorder   . Asperger's syndrome   . PDD (pervasive developmental disorder)   . DiGeorge syndrome   . Depression   . Anxiety    Axis IV: economic problems, other psychosocial or environmental problems, problems related to social environment and problems with primary support group Axis V: 41-50 serious symptoms  ADL's:  Intact  Sleep: Fair  Appetite:  Fair  Suicidal Ideation:  Denies Homicidal Ideation:  Denies AEB (as evidenced by):  Psychiatric Specialty Exam: Review of Systems  Constitutional: Negative.  Negative for fever, chills, weight loss and malaise/fatigue.  HENT: Negative.  Negative for hearing loss, ear pain, neck pain, tinnitus and ear discharge.   Eyes: Negative.  Negative for blurred vision, double vision, photophobia and pain.  Respiratory: Negative.  Negative for cough, hemoptysis and sputum production.   Cardiovascular: Negative.  Negative for chest pain, palpitations and claudication.  Gastrointestinal: Negative for heartburn, nausea, vomiting and abdominal pain.  Genitourinary: Negative for dysuria, urgency and frequency.  Musculoskeletal: Negative for myalgias and back pain.   Skin: Negative.  Negative for itching and rash.  Neurological: Negative for dizziness, tingling, tremors, sensory change and headaches.  Endo/Heme/Allergies: Negative for environmental allergies. Does not bruise/bleed easily.  Psychiatric/Behavioral: Positive for depression, suicidal ideas, hallucinations and memory loss. Negative for substance abuse. The patient is nervous/anxious. The patient does not have insomnia.     Blood pressure 123/81, pulse 82, temperature 99 F (37.2 C), temperature source Oral, resp. rate 18, height 5\' 1"  (1.549 m), weight 111.131 kg (245 lb), last menstrual period 06/23/2012.Body mass index is 46.32 kg/(m^2).  General Appearance: Casual  Eye Contact::  Fair  Speech:  Slow  Volume:  Decreased  Mood:  Anxious and Dysphoric  Affect:  Tearful  Thought Process:  Loose  Orientation:  Full (Time, Place, and Person)  Thought Content:  Rumination  Suicidal Thoughts:  No  Homicidal Thoughts:  No  Memory:  Immediate;   Fair Recent;   Fair Remote;   Fair  Judgement:  Impaired  Insight:  Lacking  Psychomotor Activity:  Normal  Concentration:  Fair  Recall:  Fair  Akathisia:  No  Handed:  Right  AIMS (if indicated):     Assets:  Communication Skills Desire for Improvement Intimacy Leisure Time Resilience  Sleep:  Number of Hours: 5.25   Current Medications: Current Facility-Administered Medications  Medication Dose Route Frequency Provider Last Rate Last Dose  . acetaminophen (TYLENOL) tablet 650 mg  650 mg Oral Q6H PRN Kerry Hough, PA-C      . alum & mag hydroxide-simeth (MAALOX/MYLANTA) 200-200-20 MG/5ML suspension 30 mL  30 mL Oral Q4H PRN Mena Goes  Simon, PA-C      . FLUoxetine (PROZAC) capsule 20 mg  20 mg Oral Daily Mojeed Akintayo   20 mg at 07/06/13 0804  . hydrOXYzine (ATARAX/VISTARIL) tablet 25 mg  25 mg Oral Q6H PRN Fransisca Kaufmann, NP   25 mg at 07/06/13 1535  . ketoconazole (NIZORAL) 2 % cream   Topical BID PRN Kerry Hough, PA-C      .  lurasidone (LATUDA) tablet 40 mg  40 mg Oral Q supper Mojeed Akintayo   40 mg at 07/05/13 1715  . magnesium hydroxide (MILK OF MAGNESIA) suspension 30 mL  30 mL Oral Daily PRN Kerry Hough, PA-C      . nicotine polacrilex (NICORETTE) gum 2 mg  2 mg Oral PRN Kerry Hough, PA-C   2 mg at 07/06/13 1258  . traZODone (DESYREL) tablet 100 mg  100 mg Oral QHS,MR X 1 Mojeed Akintayo   100 mg at 07/05/13 2301    Lab Results: No results found for this or any previous visit (from the past 48 hour(s)).  Physical Findings: AIMS: Facial and Oral Movements Muscles of Facial Expression: None, normal Lips and Perioral Area: None, normal Jaw: None, normal Tongue: None, normal,Extremity Movements Upper (arms, wrists, hands, fingers): None, normal Lower (legs, knees, ankles, toes): None, normal, Trunk Movements Neck, shoulders, hips: None, normal, Overall Severity Severity of abnormal movements (highest score from questions above): None, normal Incapacitation due to abnormal movements: None, normal, Dental Status Current problems with teeth and/or dentures?: No Does patient usually wear dentures?: No  CIWA:  CIWA-Ar Total: 2 COWS:  COWS Total Score: 2  Treatment Plan Summary: Daily contact with patient to assess and evaluate symptoms and progress in treatment Medication management  Plan: Continue crisis management and stabilization.  Medication management: Continue Prozac 20 mg daily for depressive symptoms. Continue Latuda 40 mg for psychotic symptoms.  Encouraged patient to attend groups and participate in group counseling sessions and activities.  Discharge plan in progress.  Continue current treatment plan.  Address health issues: Vitals reviewed and stable.   Medical Decision Making Problem Points:  Established problem, stable/improving (1) and Review of psycho-social stressors (1) Data Points:  Review of medication regiment & side effects (2)  I certify that inpatient services  furnished can reasonably be expected to improve the patient's condition.   Laramie Gelles NP-C 07/06/2013, 3:48 PM

## 2013-07-06 NOTE — [facility] Group Notes (Signed)
[facility] Group Notes:  (Counselor/Nursing/MHT/Case Management/Adjunct)  07/06/2013 1:15PM  Type of Therapy:  Group Therapy  Participation Level:  Active  Participation Quality:  Appropriate  Affect:  Flat  Cognitive:  Oriented  Insight:  Improving  Engagement in Group:  Limited  Engagement in Therapy:  Limited  Modes of Intervention:  Discussion, Exploration and Socialization  Summary of Progress/Problems: The topic for group was balance in life.  Pt participated in the discussion about when their life was in balance and out of balance and how this feels.  Pt discussed ways to get back in balance and short term goals they can work on to get where they want to be.   Judy Burns talked about being unbalanced since the break up with her boyfriend.  It's esp. Bad since he took up with a friend of hers.  And people telling her that there is someone better for her out there is not helpful.  She is hopeful that she can regain balance, but can't really name anything she can do.  "I hope the medication adjustment helps." [name] 07/06/2013 4:31 PM

## 2013-07-06 NOTE — Progress Notes (Signed)
Patient ID: Judy Burns, female   DOB: September 14, 1991, 22 y.o.   MRN: [id number]   D: Patient report doing well tonight. Mother came to visit when coming out of report. She was very polite and went to [place] group. She participated singing a song and helping another peer select songs. She has been on the phone and writing a letter before bed. No overt psychosis or panic attacks noted tonight. A: Staff will monitor on q 15 minute checks, follow treatment plan, and give meds as ordered. R: Cooperative on the unit.

## 2013-07-07 MED ORDER — LURASIDONE HCL 80 MG PO TABS
80.0000 mg | ORAL_TABLET | Freq: Every day | ORAL | Status: DC
  Administered 2013-07-07 – 2013-07-10 (×4): 80 mg via ORAL
  Filled 2013-07-07: qty 1
  Filled 2013-07-07: qty 3
  Filled 2013-07-07 (×3): qty 1

## 2013-07-07 NOTE — Progress Notes (Signed)
Patient ID: Judy Burns, female   DOB: [birth year], 22 y.o.   MRN: [id number] [facility] MD Progress Note  07/07/2013 1:39 PM [name]  MRN:  [id number] Subjective:   Patients states "I'm not sleeping well at night. Sometimes there is a movie playing in my head all night. I am not hearing the voice of Precious as often. I just feel kinda hyper."  Objective:  Patient shows little insight into symptoms and is childlike in her interactions with staff. Staff reported that patient had a panic attack yesterday and required a dose of prn vistaril.   Diagnosis:   Axis I: Bipolar, Depressed Axis II: Deferred Axis III:  Past Medical History  Diagnosis Date  . Hx of heart surgery   . Tetralogy of Fallot s/p repair   . GERD (gastroesophageal reflux disease)   . Bipolar 1 disorder   . Asperger's syndrome   . PDD (pervasive developmental disorder)   . DiGeorge syndrome   . Depression   . Anxiety    Axis IV: economic problems, other psychosocial or environmental problems, problems related to social environment and problems with primary support group Axis V: 41-50 serious symptoms  ADL's:  Intact  Sleep: Fair  Appetite:  Fair  Suicidal Ideation:  Denies Homicidal Ideation:  Denies AEB (as evidenced by):  Psychiatric Specialty Exam: Review of Systems  Constitutional: Negative.  Negative for fever, chills, weight loss and malaise/fatigue.  HENT: Negative.  Negative for hearing loss, ear pain, neck pain, tinnitus and ear discharge.   Eyes: Negative.  Negative for blurred vision, double vision, photophobia and pain.  Respiratory: Negative.  Negative for cough, hemoptysis and sputum production.   Cardiovascular: Negative.  Negative for chest pain, palpitations and claudication.  Gastrointestinal: Negative for heartburn, nausea, vomiting and abdominal pain.  Genitourinary: Negative for dysuria, urgency and frequency.  Musculoskeletal: Negative for myalgias and back pain.   Skin: Negative.  Negative for itching and rash.  Neurological: Negative for dizziness, tingling, tremors, sensory change and headaches.  Endo/Heme/Allergies: Negative for environmental allergies. Does not bruise/bleed easily.  Psychiatric/Behavioral: Positive for depression, suicidal ideas, hallucinations and memory loss. Negative for substance abuse. The patient is nervous/anxious. The patient does not have insomnia.     Blood pressure 123/81, pulse 82, temperature 99 F (37.2 C), temperature source Oral, resp. rate 18, height 5\' 1"  (1.549 m), weight 111.131 kg (245 lb), last menstrual period 06/23/2012.Body mass index is 46.32 kg/(m^2).  General Appearance: Casual  Eye Contact::  Fair  Speech:  Slow  Volume:  Decreased  Mood:  Anxious and Dysphoric  Affect:  Tearful  Thought Process:  Loose  Orientation:  Full (Time, Place, and Person)  Thought Content:  Rumination  Suicidal Thoughts:  No  Homicidal Thoughts:  No  Memory:  Immediate;   Fair Recent;   Fair Remote;   Fair  Judgement:  Impaired  Insight:  Lacking  Psychomotor Activity:  Normal  Concentration:  Fair  Recall:  Fair  Akathisia:  No  Handed:  Right  AIMS (if indicated):     Assets:  Communication Skills Desire for Improvement Intimacy Leisure Time Resilience  Sleep:  Number of Hours: 5   Current Medications: Current Facility-Administered Medications  Medication Dose Route Frequency Provider Last Rate Last Dose  . acetaminophen (TYLENOL) tablet 650 mg  650 mg Oral Q6H PRN Kerry Hough, PA-C   650 mg at 07/07/13 1057  . alum & mag hydroxide-simeth (MAALOX/MYLANTA) 200-200-20 MG/5ML suspension 30 mL  30 mL  Oral Q4H PRN Kerry Hough, PA-C   30 mL at 07/07/13 1057  . FLUoxetine (PROZAC) capsule 20 mg  20 mg Oral Daily Mojeed Akintayo   20 mg at 07/07/13 0843  . hydrOXYzine (ATARAX/VISTARIL) tablet 25 mg  25 mg Oral Q6H PRN Fransisca Kaufmann, NP   25 mg at 07/06/13 1535  . ketoconazole (NIZORAL) 2 % cream   Topical  BID PRN Kerry Hough, PA-C      . lurasidone (LATUDA) tablet 80 mg  80 mg Oral Q supper Fransisca Kaufmann, NP      . magnesium hydroxide (MILK OF MAGNESIA) suspension 30 mL  30 mL Oral Daily PRN Kerry Hough, PA-C      . nicotine polacrilex (NICORETTE) gum 2 mg  2 mg Oral PRN Kerry Hough, PA-C   2 mg at 07/07/13 0041  . traZODone (DESYREL) tablet 100 mg  100 mg Oral QHS,MR X 1 Mojeed Akintayo   100 mg at 07/07/13 0040    Lab Results: No results found for this or any previous visit (from the past 48 hour(s)).  Physical Findings: AIMS: Facial and Oral Movements Muscles of Facial Expression: None, normal Lips and Perioral Area: None, normal Jaw: None, normal Tongue: None, normal,Extremity Movements Upper (arms, wrists, hands, fingers): None, normal Lower (legs, knees, ankles, toes): None, normal, Trunk Movements Neck, shoulders, hips: None, normal, Overall Severity Severity of abnormal movements (highest score from questions above): None, normal Incapacitation due to abnormal movements: None, normal, Dental Status Current problems with teeth and/or dentures?: No Does patient usually wear dentures?: No  CIWA:  CIWA-Ar Total: 2 COWS:  COWS Total Score: 2  Treatment Plan Summary: Daily contact with patient to assess and evaluate symptoms and progress in treatment Medication management  Plan: Continue crisis management and stabilization.  Medication management: Continue Prozac 20 mg daily for depressive symptoms. Increase Latuda 80 mg for psychotic symptoms.  Encouraged patient to attend groups and participate in group counseling sessions and activities.  Discharge plan in progress.  Continue current treatment plan.  Address health issues: Vitals reviewed and stable.   Medical Decision Making Problem Points:  Established problem, stable/improving (1) and Review of psycho-social stressors (1) Data Points:  Review of medication regiment & side effects (2)  I certify that inpatient  services furnished can reasonably be expected to improve the patient's condition.   Juanisha Bautch NP-C 07/07/2013, 1:39 PM

## 2013-07-07 NOTE — Progress Notes (Signed)
[name] ( as she requests to be called) is seen lying in her bed in her room this morning. She tells MHT to " please have the nurse bring me my medicine.Marland KitchenMarland KitchenI just can't get down there ( to the med window) now..."   A She avoids eye contact. She is flat, sad and has a blunted affect. She is somatically focued...saying first she has leg discomfort and then switches to foot discomfort and then lower ankle pain. SHe

## 2013-07-07 NOTE — [facility] Group Notes (Signed)
[facility] LCSW Aftercare Discharge Planning Group Note   07/07/2013 8:05 AM  Participation Quality:  Did not attend    [place]

## 2013-07-07 NOTE — Progress Notes (Signed)
Adult Psychoeducational Group Note  Date:  07/07/2013 Time: 2000 Group Topic/Focus:  Wrap-Up Group:   The focus of this group is to help patients review their daily goal of treatment and discuss progress on daily workbooks.  Participation Level:  Active  Participation Quality:  Appropriate and Attentive  Affect:  Appropriate  Cognitive:  Appropriate  Insight: Appropriate  Engagement in Group:  Developing/Improving and Engaged  Modes of Intervention:  Discussion and Problem-solving  Additional Comments:  Pt stated she had a good day due to visit from her mother and feels that things are getting better for her and hopes to keep her goal of taking her medication properly without overtaking and undertaking her medications   Judy Burns Patience 07/07/2013, 9:34 PM

## 2013-07-07 NOTE — Progress Notes (Signed)
Adult Psychoeducational Group Note  Date:  07/07/2013 Time:  11:00AM  Group Topic/Focus:  Relapse Prevention Planning:   The focus of this group is to define relapse and discuss the need for planning to combat relapse.  Participation Level:  Active  Participation Quality:  Attentive and Redirectable  Affect:  Appropriate  Cognitive:  Oriented  Insight: Limited  Engagement in Group:  Engaged  Modes of Intervention:  Discussion  Additional Comments:  Pt required redirection due to her off topic and sporadic comments. She was easily redirected during this time although the behavior was repetitive. She indicated that her mother and best friend were her support systems and indicated that her father was a trigger of hers, although she expressed that she does not deal with her father currently.   Zacarias Pontes R 07/07/2013, 1:43 PM

## 2013-07-07 NOTE — [facility] Suicide Risk Assessment (Signed)
[facility] INPATIENT:  Family/Significant Other Suicide Prevention Education  Suicide Prevention Education:  Education Completed; Starleen Blue, mother, [phone] has been identified by the patient as the family member/significant other with whom the patient will be residing, and identified as the person(s) who will aid the patient in the event of a mental health crisis (suicidal ideations/suicide attempt).  With written consent from the patient, the family member/significant other has been provided the following suicide prevention education, prior to the and/or following the discharge of the patient.  The suicide prevention education provided includes the following:  Suicide risk factors  Suicide prevention and interventions  National Suicide Hotline telephone number  [facility] assessment telephone number  [facility] Emergency Assistance 911  [facility] and/or Residential Mobile Crisis Unit telephone number  Request made of family/significant other to:  Remove weapons (e.g., guns, rifles, knives), all items previously/currently identified as safety concern.    Remove drugs/medications (over-the-counter, prescriptions, illicit drugs), all items previously/currently identified as a safety concern.  The family member/significant other verbalizes understanding of the suicide prevention education information provided.  The family member/significant other agrees to remove the items of safety concern listed above.  Daryel Gerald B 07/07/2013, 3:18 PM

## 2013-07-08 NOTE — [facility] Group Notes (Signed)
[facility] Group Notes:  (Clinical Social Work)  07/08/2013  11:15-11:45AM  Summary of Progress/Problems:   The main focus of today's process group was for the patient to identify ways in which they have in the past sabotaged their own recovery and reasons they may have done this/what they received from doing it.  We then worked to identify a specific plan to avoid doing this when discharged from the hospital for this admission.  The patient expressed that she self-sabotages with thinking too much, with self medicating, and with suicide attempts by overdose.  She stated she needs to find ways to keep herself occupied.  Type of Therapy:  Group Therapy - Process  Participation Level:  Active  Participation Quality:  Attentive and Sharing  Affect:  Blunted  Cognitive:  Alert, Appropriate and Oriented  Insight:  Engaged  Engagement in Therapy:  Engaged  Modes of Intervention:  Clarification, Education, Exploration, Discussion  Ambrose Mantle, LCSW 07/08/2013, 1:36 PM

## 2013-07-08 NOTE — Progress Notes (Signed)
Patient ID: Judy Burns, female   DOB: 1991/08/20, 22 y.o.   MRN: [id number] [facility] MD Progress Note  07/08/2013 10:22 PM Judy Burns  MRN:  [id number] Subjective:   Patient states she is doing better. Better mood per pt. Feels ready for discharge tomorrow.  Objective:     Diagnosis:   Axis I: Bipolar, Depressed Axis II: Deferred Axis III:  Past Medical History  Diagnosis Date  . Hx of heart surgery   . Tetralogy of Fallot s/p repair   . GERD (gastroesophageal reflux disease)   . Bipolar 1 disorder   . Asperger's syndrome   . PDD (pervasive developmental disorder)   . DiGeorge syndrome   . Depression   . Anxiety    Axis IV: economic problems, other psychosocial or environmental problems, problems related to social environment and problems with primary support group Axis V: 41-50 serious symptoms  ADL's:  Intact  Sleep: Fair  Appetite:  Fair  Suicidal Ideation:  Denies Homicidal Ideation:  Denies AEB (as evidenced by):  Psychiatric Specialty Exam: Review of Systems  Constitutional: Negative.  Negative for fever, chills, weight loss and malaise/fatigue.  HENT: Negative.  Negative for hearing loss, ear pain, neck pain, tinnitus and ear discharge.   Eyes: Negative.  Negative for blurred vision, double vision, photophobia and pain.  Respiratory: Negative.  Negative for cough, hemoptysis and sputum production.   Cardiovascular: Negative.  Negative for chest pain, palpitations and claudication.  Gastrointestinal: Negative for heartburn, nausea, vomiting and abdominal pain.  Genitourinary: Negative for dysuria, urgency and frequency.  Musculoskeletal: Negative for myalgias and back pain.  Skin: Negative.  Negative for itching and rash.  Neurological: Negative for dizziness, tingling, tremors, sensory change and headaches.  Endo/Heme/Allergies: Negative for environmental allergies. Does not bruise/bleed easily.  Psychiatric/Behavioral: Positive for  depression, suicidal ideas, hallucinations and memory loss. Negative for substance abuse. The patient is nervous/anxious. The patient does not have insomnia.     Blood pressure 123/81, pulse 82, temperature 99 F (37.2 C), temperature source Oral, resp. rate 18, height 5\' 1"  (1.549 m), weight 111.131 kg (245 lb), last menstrual period 06/23/2012.Body mass index is 46.32 kg/(m^2).  General Appearance: Casual  Eye Contact::  Fair  Speech:  Slow  Volume:  Decreased  Mood:  Anxious and Dysphoric  Affect:  Tearful  Thought Process:  logical  Orientation:  Full (Time, Place, and Person)  Thought Content:  Rumination  Suicidal Thoughts:  No  Homicidal Thoughts:  No  Memory:  Immediate;   Fair Recent;   Fair Remote;   Fair  Judgement:  Impaired  Insight:  Lacking  Psychomotor Activity:  Normal  Concentration:  Fair  Recall:  Fair  Akathisia:  No  Handed:  Right  AIMS (if indicated):     Assets:  Communication Skills Desire for Improvement Intimacy Leisure Time Resilience  Sleep:  Number of Hours: 6.75   Current Medications: Current Facility-Administered Medications  Medication Dose Route Frequency Provider Last Rate Last Dose  . acetaminophen (TYLENOL) tablet 650 mg  650 mg Oral Q6H PRN Kerry Hough, PA-C   650 mg at 07/07/13 1057  . alum & mag hydroxide-simeth (MAALOX/MYLANTA) 200-200-20 MG/5ML suspension 30 mL  30 mL Oral Q4H PRN Kerry Hough, PA-C   30 mL at 07/07/13 1057  . FLUoxetine (PROZAC) capsule 20 mg  20 mg Oral Daily Mojeed Akintayo   20 mg at 07/08/13 0808  . hydrOXYzine (ATARAX/VISTARIL) tablet 25 mg  25 mg Oral Q6H PRN  Fransisca Kaufmann, NP   25 mg at 07/06/13 1535  . ketoconazole (NIZORAL) 2 % cream   Topical BID PRN Kerry Hough, PA-C      . lurasidone (LATUDA) tablet 80 mg  80 mg Oral Q supper Fransisca Kaufmann, NP   80 mg at 07/08/13 1721  . magnesium hydroxide (MILK OF MAGNESIA) suspension 30 mL  30 mL Oral Daily PRN Kerry Hough, PA-C      . nicotine polacrilex  (NICORETTE) gum 2 mg  2 mg Oral PRN Kerry Hough, PA-C   2 mg at 07/08/13 2124  . traZODone (DESYREL) tablet 100 mg  100 mg Oral QHS,MR X 1 Mojeed Akintayo   100 mg at 07/08/13 2124    Lab Results: No results found for this or any previous visit (from the past 48 hour(s)).  Physical Findings: AIMS: Facial and Oral Movements Muscles of Facial Expression: None, normal Lips and Perioral Area: None, normal Jaw: None, normal Tongue: None, normal,Extremity Movements Upper (arms, wrists, hands, fingers): None, normal Lower (legs, knees, ankles, toes): None, normal, Trunk Movements Neck, shoulders, hips: None, normal, Overall Severity Severity of abnormal movements (highest score from questions above): None, normal Incapacitation due to abnormal movements: None, normal, Dental Status Current problems with teeth and/or dentures?: No Does patient usually wear dentures?: No  CIWA:  CIWA-Ar Total: 2 COWS:  COWS Total Score: 2  Treatment Plan Summary: Daily contact with patient to assess and evaluate symptoms and progress in treatment Medication management  Plan: Continue crisis management and stabilization.  Continue current meds.   Medical Decision Making Problem Points:  Established problem, stable/improving (1) and Review of psycho-social stressors (1) Data Points:  Review of medication regiment & side effects (2)  I certify that inpatient services furnished can reasonably be expected to improve the patient's condition.   Wonda Cerise  07/08/2013, 10:22 PM

## 2013-07-08 NOTE — Progress Notes (Signed)
The focus of this group is to help patients review their daily goal of treatment and discuss progress on daily workbooks. Pt attended the evening group and responded to all discussion prompts from the Writer. Pt reported having a good day on the unit due to a visit from her Mother and being able to visit the [facility]. 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 Pt's affect was bright.

## 2013-07-08 NOTE — Progress Notes (Signed)
Patient ID: Judy Burns, female   DOB: 06/21/1991, 22 y.o.   MRN: [id number] D: Patient stated she is doing well. Pt mood/affect seemed anxious. Pt denies SI/HI/AVH and pain. Pt reports having symptoms of urinary tract infection.  Pt does not appear to be responding to internal stimuli. Pt attended evening wrap up group and engaged in discussion. Pt denies any needs or concerns. Cooperative with assessment. No acute distressed noted at this time.   A: Met with pt 1:1. Urine sample collected from pt. Medications administered as prescribed. Writer encouraged pt to discuss feelings. Pt encouraged to come to staff with any question or concerns. 15 minutes checks for safety.   R: Patient remains safe. she is complaint with medications and denies any adverse reaction. Continue current POC.

## 2013-07-08 NOTE — Progress Notes (Signed)
Adult Psychoeducational Group Note  Date:  07/08/2013 Time:  3:00PM Group Topic/Focus:  Healthy Coping Skills  Participation Level:  Active  Participation Quality:  Appropriate and Attentive  Affect:  Appropriate  Cognitive:  Alert and Appropriate  Insight: Appropriate  Engagement in Group:  Engaged  Modes of Intervention:  Discussion  Additional Comments:  Pt. Was attentive and appropriate during today group discussion. Pt was able to complete Love Language Quiz. Pt. Stated that she enjoy receiving gifts and spending quality of time. Pt shared how she is always giving gifts to her parents and her parents giving her stuff. Pt shared how they spend quality time with everyone daily.   Bing Plume D 07/08/2013, 5:30 PM

## 2013-07-08 NOTE — Progress Notes (Signed)
07/08/13 D-  Patient presents with depressed mood and blunted affect, continues to admit to auditory hall but is vague regarding content.  "[place] as I leave here." Appearance remains dishelved and s/w suggestive reminded pt to change shirt. Reports energy level is fair and sleep is fair with trazodone.  A- Allowed pt to ventilate during 1;1, participate in treatment goals   R- Compliant with medications, maintained on safety checks.

## 2013-07-08 NOTE — [facility] Group Notes (Signed)
[facility] Group Notes:  (Nursing/MHT/Case Management/Adjunct)  Date:  07/08/2013  Time:  11:07 AM  Type of Therapy:  Psychoeducational Skills  Participation Level:  None  Participation Quality:  did not attend  Affect:  did not attend  Cognitive:  did not attend  Insight:  None  Engagement in Group:  None  Modes of Intervention:  did not attend  Summary of Progress/Problems: Group discussed healthy coping skills and reviewed self inventory sheets.   Judy Burns 07/08/2013, 11:07 AM

## 2013-07-09 MED ORDER — TRAZODONE HCL 50 MG PO TABS
150.0000 mg | ORAL_TABLET | Freq: Every evening | ORAL | Status: DC | PRN
  Administered 2013-07-09: 150 mg via ORAL
  Filled 2013-07-09: qty 1
  Filled 2013-07-09: qty 18
  Filled 2013-07-09 (×3): qty 1
  Filled 2013-07-09: qty 18

## 2013-07-09 NOTE — [facility] Group Notes (Signed)
[facility] Group Notes: (Clinical Social Work)   07/09/2013      Type of Therapy:  Group Therapy   Participation Level:  Did Not Attend    Ambrose Mantle, LCSW 07/09/2013, 2:07 PM

## 2013-07-09 NOTE — Progress Notes (Signed)
Patient ID: Judy Burns, female   DOB: 03-08-1991, 22 y.o.   MRN: [id number] [facility] MD Progress Note  07/09/2013 5:53 PM Judy Burns  MRN:  [id number] Subjective:   Sleeping until 11 AM today. Reports leg pain now.mood is not good today.  Objective:     Diagnosis:   Axis I: Bipolar, Depressed Axis II: Deferred Axis III:  Past Medical History  Diagnosis Date  . Hx of heart surgery   . Tetralogy of Fallot s/p repair   . GERD (gastroesophageal reflux disease)   . Bipolar 1 disorder   . Asperger's syndrome   . PDD (pervasive developmental disorder)   . DiGeorge syndrome   . Depression   . Anxiety    Axis IV: economic problems, other psychosocial or environmental problems, problems related to social environment and problems with primary support group Axis V: 41-50 serious symptoms  ADL's:  Intact  Sleep: Fair  Appetite:  Fair  Suicidal Ideation:  Denies Homicidal Ideation:  Denies AEB (as evidenced by):  Psychiatric Specialty Exam: Review of Systems  Constitutional: Negative.  Negative for fever, chills, weight loss and malaise/fatigue.  HENT: Negative.  Negative for hearing loss, ear pain, neck pain, tinnitus and ear discharge.   Eyes: Negative.  Negative for blurred vision, double vision, photophobia and pain.  Respiratory: Negative.  Negative for cough, hemoptysis and sputum production.   Cardiovascular: Negative.  Negative for chest pain, palpitations and claudication.  Gastrointestinal: Negative for heartburn, nausea, vomiting and abdominal pain.  Genitourinary: Negative for dysuria, urgency and frequency.  Musculoskeletal: Negative for myalgias and back pain.  Skin: Negative.  Negative for itching and rash.  Neurological: Negative for dizziness, tingling, tremors, sensory change and headaches.  Endo/Heme/Allergies: Negative for environmental allergies. Does not bruise/bleed easily.  Psychiatric/Behavioral: Positive for depression, suicidal ideas,  hallucinations and memory loss. Negative for substance abuse. The patient is nervous/anxious. The patient does not have insomnia.     Blood pressure 138/91, pulse 103, temperature 98.4 F (36.9 C), temperature source Oral, resp. rate 18, height 5\' 1"  (1.549 m), weight 111.131 kg (245 lb), last menstrual period 06/23/2012.Body mass index is 46.32 kg/(m^2).  General Appearance: Casual  Eye Contact::  Fair  Speech:  Slow  Volume:  Decreased  Mood:  Anxious and Dysphoric  Affect:  blunt  Thought Process:  logical  Orientation:  Full (Time, Place, and Person)  Thought Content:  Rumination  Suicidal Thoughts:  No  Homicidal Thoughts:  No  Memory:  Immediate;   Fair Recent;   Fair Remote;   Fair  Judgement:  Impaired  Insight:  Lacking  Psychomotor Activity:  Normal  Concentration:  Fair  Recall:  Fair  Akathisia:  No  Handed:  Right  AIMS (if indicated):     Assets:  Communication Skills Desire for Improvement Intimacy Leisure Time Resilience  Sleep:  Number of Hours: 6.5   Current Medications: Current Facility-Administered Medications  Medication Dose Route Frequency Provider Last Rate Last Dose  . acetaminophen (TYLENOL) tablet 650 mg  650 mg Oral Q6H PRN Kerry Hough, PA-C   650 mg at 07/07/13 1057  . alum & mag hydroxide-simeth (MAALOX/MYLANTA) 200-200-20 MG/5ML suspension 30 mL  30 mL Oral Q4H PRN Kerry Hough, PA-C   30 mL at 07/07/13 1057  . FLUoxetine (PROZAC) capsule 20 mg  20 mg Oral Daily Mojeed Akintayo   20 mg at 07/09/13 0814  . hydrOXYzine (ATARAX/VISTARIL) tablet 25 mg  25 mg Oral Q6H PRN Fransisca Kaufmann,  NP   25 mg at 07/08/13 2230  . ketoconazole (NIZORAL) 2 % cream   Topical BID PRN Kerry Hough, PA-C      . lurasidone (LATUDA) tablet 80 mg  80 mg Oral Q supper Fransisca Kaufmann, NP   80 mg at 07/09/13 1709  . magnesium hydroxide (MILK OF MAGNESIA) suspension 30 mL  30 mL Oral Daily PRN Kerry Hough, PA-C      . nicotine polacrilex (NICORETTE) gum 2 mg  2 mg  Oral PRN Kerry Hough, PA-C   2 mg at 07/09/13 1710  . traZODone (DESYREL) tablet 150 mg  150 mg Oral QHS,MR X 1 Wonda Cerise, MD        Lab Results: No results found for this or any previous visit (from the past 48 hour(s)).  Physical Findings: AIMS: Facial and Oral Movements Muscles of Facial Expression: None, normal Lips and Perioral Area: None, normal Jaw: None, normal Tongue: None, normal,Extremity Movements Upper (arms, wrists, hands, fingers): None, normal Lower (legs, knees, ankles, toes): None, normal, Trunk Movements Neck, shoulders, hips: None, normal, Overall Severity Severity of abnormal movements (highest score from questions above): None, normal Incapacitation due to abnormal movements: None, normal, Dental Status Current problems with teeth and/or dentures?: No Does patient usually wear dentures?: No  CIWA:  CIWA-Ar Total: 2 COWS:  COWS Total Score: 2  Treatment Plan Summary: Daily contact with patient to assess and evaluate symptoms and progress in treatment Medication management  Plan:   encouged not to sleep during day time  Increased trazodne for sleep  Medical Decision Making Problem Points:  Established problem, stable/improving (1) and Review of psycho-social stressors (1) Data Points:  Review of medication regiment & side effects (2)  I certify that inpatient services furnished can reasonably be expected to improve the patient's condition.   Wonda Cerise  07/09/2013, 5:53 PM

## 2013-07-09 NOTE — Progress Notes (Signed)
Patient ID: Judy Burns, female   DOB: 03/21/1991, 22 y.o.   MRN: [id number] 07-09-13 nursing shift note: D: pt had slept most of the day and not been very visible in the mileu. She didn't not come to groups. Pt has been in bed most of the day. She is taking her Prozac and is not any side effects, as well as denies any SI. She has voiced no complaints or pain issues. A: staff continues to support and encourage this patient. R: on her inventory sheet she wrote she requested sleep medication, appetite good, energy normal, attention good with her depression and hopelessness both at 0. After discharge she plans to " stay consistent with taking med's as prescribed".  RN will continue to monitor and Q 15 min ck's continue.

## 2013-07-09 NOTE — Progress Notes (Signed)
Patient ID: Judy Burns, female   DOB: [birth year], 22 y.o.   MRN: [id number]  D: Patient pleasant on approach tonight. Reports that she is still having trouble sleeping at night. Mood doing better at this time. Asking when she will be discharged. No overt psychosis tonight. A: Staff will monitor on q 15 minute checks, follow treatment plan, and give meds as ordered. R: Cooperative on unit.

## 2013-07-09 NOTE — Progress Notes (Signed)
Patient ID: Judy Burns, female   DOB: Aug 14, 1991, 22 y.o.   MRN: [id number] Psychoeducational Group Note  Date:  07/09/2013 Time:  1000am  Group Topic/Focus:  Making Healthy Choices:   The focus of this group is to help patients identify negative/unhealthy choices they were using prior to admission and identify positive/healthier coping strategies to replace them upon discharge.  Participation Level:  Did Not Attend  Participation Quality:    Affect:   Cognitive:  Insight:  Engagement in Group:    Additional Comments:  Inventory and Psychoeducational group   Valente David 07/09/2013,10:33 AM

## 2013-07-09 NOTE — Progress Notes (Signed)
Adult Psychoeducational Group Note  Date:  07/09/2013 Time:  8:00PM Group Topic/Focus:  Wrap-Up Group:   The focus of this group is to help patients review their daily goal of treatment and discuss progress on daily workbooks.  Participation Level:  Active  Participation Quality:  Appropriate and Attentive  Affect:  Appropriate  Cognitive:  Alert and Appropriate  Insight: Appropriate  Engagement in Group:  Engaged  Modes of Intervention:  Discussion  Additional Comments:  Pt. Was attentive and appropriate during tonight's group discussion. Pt shared that she enjoyed a visit from her mom today. Pt shared that she is having a hard time sleeping and being trying to rest all day. Pt stated how she enjoyed going outside today.   Bing Plume D 07/09/2013, 10:11 PM

## 2013-07-09 NOTE — Progress Notes (Signed)
Patient ID: Judy Burns, female   DOB: 10/31/1991, 22 y.o.   MRN: [id number]  D: Patient with dull, flat affect endorsing depression. Pt with little interaction with staff, seemingly preoccupied. A: Monitor Q 15 minutes, encourage group participation and staff/peer interaction. Administer medications as ordered by MD. R: Patient compliant with group session, denies SI or plans to harm herself. Pt withdrawn at times.

## 2013-07-10 DIAGNOSIS — F411 Generalized anxiety disorder: Secondary | ICD-10-CM

## 2013-07-10 MED ORDER — KETOCONAZOLE 2 % EX CREA: TOPICAL_CREAM | Freq: Two times a day (BID) | CUTANEOUS | Status: AC | PRN

## 2013-07-10 MED ORDER — FLUOXETINE HCL 20 MG PO CAPS: 20.0000 mg | ORAL_CAPSULE | Freq: Every day | ORAL | Status: AC

## 2013-07-10 MED ORDER — LURASIDONE HCL 80 MG PO TABS: 80.0000 mg | ORAL_TABLET | Freq: Every day | ORAL | Status: AC

## 2013-07-10 MED ORDER — KETOCONAZOLE 2 % EX CREA: TOPICAL_CREAM | Freq: Two times a day (BID) | CUTANEOUS | Status: DC | PRN

## 2013-07-10 MED ORDER — TRAZODONE HCL 150 MG PO TABS: 150.0000 mg | ORAL_TABLET | Freq: Every evening | ORAL | Status: AC | PRN

## 2013-07-10 MED ORDER — HYDROXYZINE HCL 25 MG PO TABS: 25.0000 mg | ORAL_TABLET | Freq: Four times a day (QID) | ORAL | Status: AC | PRN

## 2013-07-10 NOTE — Tx Team (Signed)
Interdisciplinary Treatment Plan Update (Adult)  Date: 07/10/2013  Time Reviewed:  9:45 AM  Progress in Treatment: Attending groups: Yes Participating in groups:  Yes Taking medication as prescribed:  Yes Tolerating medication:  Yes Family/Significant othe contact made: Yes  Patient understands diagnosis:  Yes Discussing patient identified problems/goals with staff:  Yes Medical problems stabilized or resolved:  Yes Denies suicidal/homicidal ideation: Yes Issues/concerns per patient self-inventory:  Yes Other:  New problem(s) identified: N/A  Discharge Plan or Barriers: Pt will follow up at [facility] for medication management and resume the day program.  Reason for Continuation of Hospitalization: Stable to d/c  Comments: N/A  Estimated length of stay: D/C today  For review of initial/current patient goals, please see plan of care.  Attendees: Patient:     Family:     Physician:  Dr. Thedore Mins 07/10/2013  10:00 am  Nursing:   Joslyn Devon, RN 07/10/2013 10:00 am  Clinical Social Worker:  Reyes Ivan, LCSWA 07/10/2013  10:00 am  Other:    Other:     Other:     Other:     Other:    Other:    Other:    Other:    Other:    Other:     Scribe for Treatment Team:   Carmina Miller, 07/10/2013 12:40 PM

## 2013-07-10 NOTE — [facility] Suicide Risk Assessment (Signed)
Suicide Risk Assessment  Discharge Assessment     Demographic Factors:  Adolescent or young adult, Low socioeconomic status, Unemployed and female  Mental Status Per Nursing Assessment::   On Admission:     Current Mental Status by Physician: patient denies suicidal ideation, intent or plan  Loss Factors: Decrease in vocational status and Financial problems/change in socioeconomic status  Historical Factors: Impulsivity  Risk Reduction Factors:   Sense of responsibility to family, Living with another person, especially a relative and Positive social support  Continued Clinical Symptoms:  Bipolar Disorder:   Mixed State  Cognitive Features That Contribute To Risk:  Closed-mindedness Polarized thinking    Suicide Risk:  Minimal: No identifiable suicidal ideation.  Patients presenting with no risk factors but with morbid ruminations; may be classified as minimal risk based on the severity of the depressive symptoms  Discharge Diagnoses:   AXIS I:  Bipolar 1 disorder MRE mixed episodes  AXIS II:  Deferred AXIS III:   Past Medical History  Diagnosis Date  . Hx of heart surgery   . Tetralogy of Fallot s/p repair   . GERD (gastroesophageal reflux disease)   . Asperger's syndrome   . PDD (pervasive developmental disorder)   . DiGeorge syndrome   . Depression   . Anxiety    AXIS IV:  other psychosocial or environmental problems and problems related to social environment AXIS V:  61-70 mild symptoms  Plan Of Care/Follow-up recommendations:  Activity:  as tolerated Diet:  healthy Tests:  routine blood test Other:  patient to keep her after care appointment  Is patient on multiple antipsychotic therapies at discharge:  No   Has Patient had three or more failed trials of antipsychotic monotherapy by history:  No  Recommended Plan for Multiple Antipsychotic Therapies: N/A  Imoni Kohen,MD 07/10/2013, 9:26 AM

## 2013-07-10 NOTE — [facility] Group Notes (Signed)
[facility] LCSW Group Therapy  07/10/2013  1:15 PM   Type of Therapy:  Group Therapy  Participation Level:  Active  Participation Quality:  Appropriate and Attentive  Affect:  Appropriate and Calm  Cognitive:  Alert and Appropriate  Insight:  Developing/Improving and Engaged  Engagement in Therapy:  Developing/Improving and Engaged  Modes of Intervention:  Clarification, Confrontation, Discussion, Education, Exploration, Limit-setting, Orientation, Problem-solving, Rapport Building, Dance movement psychotherapist, Socialization and Support  Summary of Progress/Problems: Pt identified obstacles faced currently and processed barriers involved in overcoming these obstacles. Pt identified steps necessary for overcoming these obstacles and explored motivation (internal and external) for facing these difficulties head on. Pt further identified one area of concern in their lives and chose a goal to focus on for today.  Pt shared that her biggest obstacle is depression and states it is a struggle daily to cope.  Pt was able to identify how to overcome her depression, such as knowing her triggers, utilizing the coping skills learned and staying on meds.  Pt was insightful on how to overcome her depression.  Pt actively participated and was engaged in group discussion.    Reyes Ivan, LCSWA 07/10/2013 2:18 PM

## 2013-07-10 NOTE — Progress Notes (Signed)
[facility] Adult Case Management Discharge Plan :  Will you be returning to the same living situation after discharge: Yes,  returning home At discharge, do you have transportation home?:Yes,  access to transportation - mother will pick pt up Do you have the ability to pay for your medications:Yes,  access to meds  Release of information consent forms completed and in the chart;  Patient's signature needed at discharge.  Patient to Follow up at: Follow-up Information   Follow up with [facility] On 07/26/2013. (10:40AM with Tamela Oddi)    Contact information:   [address]  [place]  [336] [phone]      Follow up with [facility] day [facility]. (Resume the day program the day after you leave the hospital)    Contact information:   [address]  [place] [[phone]] [phone]      Patient denies SI/HI:   Yes,  denies SI/HI    Safety Planning and Suicide Prevention discussed:  Yes,  discussed with pt and pt's mother.  See suicide prevention education note.   Carmina Miller 07/10/2013, 10:25 AM

## 2013-07-10 NOTE — Progress Notes (Signed)
Discharge note: Pt and guardian both received verbal and written discharge instructions. Both agreed to f/u appt and med regimen. Pt received all belongings from room and locker. Pt denies SI/HI/AVH. Pt received sample meds, and prescriptions. Pt left [facility] safely with her mother Delice Bison).

## 2013-07-10 NOTE — Progress Notes (Signed)
Recreation Therapy Notes  Date: 08.18.2014 Time: 9:30am Location: [address] Dayroom  Group Topic: Decision Making  Goal Area(s) Addresses:  Patient will verbalize benefit of using good decision making skills. Patient will verbalize way to encourage good decision making in personal life.  Behavioral Response: Appropriate, Engaged  Intervention: Questions & Answers  Activity: Choices In a Jar. Patients were asked to select a card from the Choices in a Jar jar and select one option on the card. Each card contains an either or question used to encourage decision making skills.    Education: Discharge Planning, Personal Organization  Education Outcome: Acknowledges understanding  Clinical Observations/Feedback: Patient arrived to group session approximately 5 minutes late. Upon arrival patient was brought up to speed on group discussion, as well as activity, as well as activity. Patient verbalized understanding and immediatly engaged in group session. Patient appropriately participated in activity, selecting a card, choosing an option and giving valid justification for choice made. Patient made no contributions to wrap up discussion, but appeared to actively listen, as she maintained appropriate eye contact with speaker.   Marykay Lex Jaina Morin, LRT/CTRS  Exavior Kimmons L 07/10/2013 1:06 PM

## 2013-07-10 NOTE — Discharge Summary (Signed)
Physician Discharge Summary Note  Patient:  Judy Burns is an 22 y.o., female MRN:  [id number] DOB:  1991/10/26 Patient phone:  [phone] (home)  Patient address:   [address] [name] [address],   Date of Admission:  07/04/2013 Date of Discharge: 07/10/2013  Reason for Admission:  Suicidal, psychotic  Discharge Diagnoses: Principal Problem:   Bipolar 1 disorder  Review of Systems  Constitutional: Negative.   HENT: Negative.   Eyes: Negative.   Respiratory: Negative.   Cardiovascular: Negative.   Gastrointestinal: Negative.   Genitourinary: Negative.   Musculoskeletal: Negative.   Skin: Negative.   Neurological: Negative.   Endo/Heme/Allergies: Negative.   Psychiatric/Behavioral: Positive for depression. The patient is nervous/anxious.    Axis Diagnosis:   AXIS I:  Anxiety Disorder NOS and Bipolar, Depressed AXIS II:  Deferred AXIS III:   Past Medical History  Diagnosis Date  . Hx of heart surgery   . Tetralogy of Fallot s/p repair   . GERD (gastroesophageal reflux disease)   . Bipolar 1 disorder   . Asperger's syndrome   . PDD (pervasive developmental disorder)   . DiGeorge syndrome   . Depression   . Anxiety    AXIS IV:  other psychosocial or environmental problems, problems related to social environment and problems with primary support group AXIS V:  61-70 mild symptoms  Level of Care:  OP  Hospital Course:  On admission: 22 year old female who presented to [facility] wanting help with depression and hearing voices. The patient reports that she lives with her mother who is her main support. She is noted to be a poor historian during the interview stating to many questions "Can't you just call my mother? She probably knows all this stuff." Patient does state "I've been depressed since my boyfriend cheated on me. My best friend told me about it. I was fine then started hearing a voice named Precious that I met once before when I was traveling in my  sleep. I can go to different planes. I'm just not in tune with reality. The voices tells me to figure out a way to get off this Earth because it's not safe for me. It might be safer to be dead because you can't die twice. There is not way out though." When asked about past suicide attempts patient states "I have tried to overdose about thirty times. But I don't remember any of my childhood. It's like everything is a blur. All I know is that I've been crying a great deal and not sleeping. I don't want to feel like I am in danger." The patient appears to have difficulty providing a history possibly due to cognitive impairment that is noted in her past medical history.   During hospitalization:  Medications managed--Intuniv, Seroquel, and Ambien discontinued.  Prozac from her home medications was decreased from 60 mg to 20 mg for depression.  Vistaril 25 mg every six hours PRN anxiety, Latuda 80 mg at 5 pm for depression, and Trazodone 150 mg may repeat x 1 for sleep issues.  Ketoconazole 2% BID PRN for itching continued from her home medications.  [place] attended and participated in groups, mood stabilized and psychosis dissipated.  Patient denied suicidal/homicidal ideations and auditory/visual hallucinations, follow-up appointments encouraged to attend, Rx given with a 3 day supply of medications.  Kamren is mentally and physically stable for discharge.  Consults:  None  Significant Diagnostic Studies:  labs: Completed and reviewed, stable  Discharge Vitals:   Blood  pressure 101/69, pulse 88, temperature 98.1 F (36.7 C), temperature source Oral, resp. rate 18, height 5\' 1"  (1.549 m), weight 111.131 kg (245 lb), last menstrual period 06/23/2012. Body mass index is 46.32 kg/(m^2). Lab Results:   No results found for this or any previous visit (from the past 72 hour(s)).  Physical Findings: AIMS: Facial and Oral Movements Muscles of Facial Expression: None, normal Lips and Perioral Area: None,  normal Jaw: None, normal Tongue: None, normal,Extremity Movements Upper (arms, wrists, hands, fingers): None, normal Lower (legs, knees, ankles, toes): None, normal, Trunk Movements Neck, shoulders, hips: None, normal, Overall Severity Severity of abnormal movements (highest score from questions above): None, normal Incapacitation due to abnormal movements: None, normal Patient's awareness of abnormal movements (rate only patient's report): No Awareness, Dental Status Current problems with teeth and/or dentures?: No Does patient usually wear dentures?: No  CIWA:  CIWA-Ar Total: 2 COWS:  COWS Total Score: 2  Psychiatric Specialty Exam: See Psychiatric Specialty Exam and Suicide Risk Assessment completed by Attending Physician prior to discharge.  Discharge destination:  Home  Is patient on multiple antipsychotic therapies at discharge:  No   Has Patient had three or more failed trials of antipsychotic monotherapy by history:  No  Recommended Plan for Multiple Antipsychotic Therapies:  N/A  Discharge Orders   Future Orders Complete By Expires   Activity as tolerated - No restrictions  As directed    Diet - low sodium heart healthy  As directed        Medication List    STOP taking these medications       guanFACINE 1 MG Tb24  Commonly known as:  INTUNIV     ibuprofen 200 MG tablet  Commonly known as:  ADVIL,MOTRIN     QUEtiapine 300 MG tablet  Commonly known as:  SEROQUEL     zolpidem 5 MG tablet  Commonly known as:  AMBIEN      TAKE these medications     Indication   albuterol (2.5 MG/3ML) 0.083% nebulizer solution  Commonly known as:  PROVENTIL  Take 3 mLs (2.5 mg total) by nebulization every 4 (four) hours as needed for wheezing.      FLUoxetine 20 MG capsule  Commonly known as:  PROZAC  Take 1 capsule (20 mg total) by mouth daily.   Indication:  Depressive Phase of Manic-Depression     hydrOXYzine 25 MG tablet  Commonly known as:  ATARAX/VISTARIL  Take 1  tablet (25 mg total) by mouth every 6 (six) hours as needed for anxiety.      ketoconazole 2 % cream  Commonly known as:  NIZORAL  Apply topically 2 (two) times daily as needed. For itching      lurasidone 80 MG Tabs tablet  Commonly known as:  LATUDA  Take 1 tablet (80 mg total) by mouth daily with supper.   Indication:  Depressive Phase of Manic-Depression     traZODone 150 MG tablet  Commonly known as:  DESYREL  Take 1 tablet (150 mg total) by mouth at bedtime and may repeat dose one time if needed.   Indication:  Trouble Sleeping           Follow-up Information   Follow up with [facility] On 07/26/2013. (10:40AM with Tamela Oddi)    Contact information:   [address]  [place]  [phone]      Follow up with [facility] day [facility]. (Resume the day program the day after you  leave the hospital)    Contact information:   [address]  [place] [phone]] [phone]      Follow-up recommendations:  Activity:  As tolerated Diet:  Low-sodium heart healthy diet  Comments:  Patient will continue her care at [facility].  Total Discharge Time:  Greater than 30 minutes.  SignedNanine Means, PMH-NP 07/10/2013, 9:29 AM

## 2013-07-12 NOTE — Discharge Summary (Signed)
Seen and agreed. Brieanna Nau, MD 

## 2013-07-13 NOTE — Progress Notes (Signed)
Patient Discharge Instructions:  After Visit Summary (AVS):   Faxed to:  07/13/13 Discharge Summary Note:   Faxed to:  07/13/13 Psychiatric Admission Assessment Note:   Faxed to:  07/13/13 Suicide Risk Assessment - Discharge Assessment:   Faxed to:  07/13/13 Faxed/Sent to the Next Level Care provider:  07/13/13 Faxed to [facility] @ [phone] Faxed to [facility] @ [phone]  Jerelene Redden, 07/13/2013, 3:12 PM

## 2015-03-17 NOTE — H&P (Signed)
PATIENT NAME:  Judy Burns, Judy Burns MR#:  [id number] DATE OF BIRTH:  Apr 09, 1991  DATE OF ADMISSION:  12/23/2011  REFERRING PHYSICIAN: Dana Allan, MD  ADMITTING PHYSICIAN: Caryn Section, MD   REASON FOR ADMISSION: Paranoid and delusional thoughts as well as auditory and visual hallucinations.   IDENTIFYING INFORMATION: Judy Burns is a 24 year old single Caucasian female with a diagnosis of DiGeorge syndrome as well as a mood disorder with psychosis and PTSD currently living in a group home, [facility] in the [place] area for the past six months. Her legal guardian is her biological mother, Judy Burns, who lives in [place].    HISTORY OF PRESENT ILLNESS: [name] is a 24 year old single Caucasian female with a prior diagnosis of DiGeorge syndrome as well as a mood disorder with psychosis and PTSD who was brought to the Emergency Room by the group home secondary to having problems with auditory and visual hallucinations as well as paranoid thoughts. The patient also admits to worsening depressive symptoms over the past two weeks including feelings of hopelessness, frequent crying spells, decreased energy and fatigue. She denies any change in appetite, weight gain or weight loss. She says that she sleeps well with the Seroquel that she takes at bedtime. She has been having visual hallucinations of seeing a little boy telling her to "crossover to the other side". The patient calls the other side "the further". The patient believes that the little boy is trying to hurt her and says that sometimes he does have a knife and that she has been hearing him talk to her and says that she sometimes talks back to him. Collateral information from the patient's mother indicates that when she does go through periods of psychosis she seems to have a similar hallucination of this little boy. In addition, the patient has been having beliefs that she may be a witch. She has been studying  witchcraft and wants to be able to practice witchcraft. In addition, she feels like cutting. She does have a history of cutting in the past but has not cut for the past several months. The patient does have a history of multiple overdoses and at least ten psychiatric hospitalizations. Per her mother, she has been hospitalized at [facility] and [facility] before. The patient does have a history of mood symptoms including irritability but no history of any grandiose delusions, hyperreligious thoughts, or hypersexual behavior. No history of any gross manic symptoms per her mother. No history of any heavy alcohol use or illicit drug use.   The patient has a prior diagnosis of PTSD. Per her mother, this traumatic experience was involving the death of multiple family members approximately three years ago including her maternal grandfather and two aunts. At times the patient says that she wants to cross over to the other side in order to be with her aunt. There is no history of any prior physical or sexual abuse per the patient as well as collateral information obtained from mother.   PAST PSYCHIATRIC HISTORY: The patient has a prior diagnosis of a mood disorder and PTSD. She is currently followed by [facility] [place] and has been living in the [place] area for the past six months. There is a history of multiple overdose attempts in the past as well as a history of cutting. She last cut approximately 2 to 3 months ago.   Current psychotropic medications include Seroquel 600 mg at bedtime and Prozac 60 mg daily. She has been tried on a  number of mood stabilizers and antidepressants in the past per her mother, although the patient nor her mother could remember any of these medications at the time.  SUBSTANCE ABUSE HISTORY: The patient has tried marijuana and alcohol once in the past and does not care for other drug. She denies any current or regular use of any illicit drugs or alcohol. She does smoke 1 pack  of cigarettes per day and has been smoking since the age of 53.   FAMILY PSYCHIATRIC HISTORY: The patient's mother has a diagnosis of bipolar disorder.   PAST MEDICAL HISTORY:  1. History of a CVA and seizures as an infant. 2. DiGeorge syndrome. 3. 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  4. Recent urinary tract infection.   OUTPATIENT MEDICATIONS:  1. Seroquel 600 mg nightly. 2. Prozac 60 mg daily.   ALLERGIES: No known drug allergies.   SOCIAL HISTORY: The patient was born in [place] originally but came to [place] when she was [age] months old. She was raised by her mother primarily in the [place] area. She stayed with her mother until the age of 68 and then went to live in a group home. She lived in about two group homes before moving to [facility] 5 to 6 months ago. The patient currently likes her current group home and her mother has also been very happy with her group home living situation. Her mother, Judy Burns, is her legal guardian, phone number [phone]. The patient has never been married and has no children. She finished high school but has no further education. No history of any physical or sexual abuse.   LEGAL HISTORY: No history of any prior arrests or incarcerations.  MENTAL STATUS EXAMINATION: [name] is a 24 year old obese Caucasian female who is lying in her hospital bed in burgundy scrub pants and a lime green shirt. She was fully alert and she could name the month and the year but had a difficult time coming up with the day of the month. She was able to name the day of the week. She spelled world backwards as "dlow". She had a difficult time with serial sevens but could do serial threes counting back from 20. Abstraction was good. Speech was regular rate and rhythm, fluent and coherent. Mood was depressed and affect was congruent. Thought processes were linear, logical, and goal directed. She denied any current suicidal  thoughts but said that she had urges to cut. She denied any homicidal thoughts. She did endorse problems with auditory and visual hallucinations of seeing a little boy. She also had some paranoid thoughts that this boy was trying to kill her. Judgment and insight were fair. Attention and concentration were fairly good.   SUICIDE RISK ASSESSMENT: At this time Ms. Gargus remains at a moderately elevated risk of harm to self and others secondary to history of self-destructive behaviors in the past including overdosing and cutting. She is not feeling safe at the current time returning to the group home. She denies having any access to guns.   REVIEW OF SYSTEMS: CONSTITUTIONAL: The patient does complain of fatigue and decreased energy level for the past two months. She denies any weakness or weight changes. She denies any fever, chills, or night sweats. HEENT: She denies any headaches or dizziness. She denies any diplopia or blurred vision. She denies any hearing loss. She does complain of some pain in the left side of her neck and some difficulty swallowing. RESPIRATORY: She denies  any shortness of breath or cough. CARDIOVASCULAR: She denies any chest pain or orthopnea. GI: She denies any nausea, vomiting, abdominal pain, or change in bowel movements. GU: She denies incontinence or problems with frequency of urine. She does report having some dysuria. No incontinence. ENDOCRINE: She denies any heat or cold intolerance. LYMPHATIC: She denies anemia or easy bruising. MUSCULOSKELETAL: She denies any muscle or joint pain. NEUROLOGICAL: She denies any tingling or weakness. PSYCHIATRIC: Please see history of present illness.   PHYSICAL EXAMINATION:   VITAL SIGNS: Blood pressure 111/59, heart rate 78, respirations 18, temperature 98, pulse oximetry 97% on room air.   HEENT: Normocephalic, atraumatic. Pupils equal, round, and reactive to light and accommodation. Extraocular movements intact. Oral mucosa moist.    NECK: Supple. No cervical lymphadenopathy or thyromegaly present. The patient did have some tenderness to palpation in the left side of her neck.   LUNGS: Clear to auscultation bilaterally. No crackles, rales, or rhonchi.   CARDIAC: S1, S2, present. Regular rate and rhythm. No murmurs, rubs, or gallops.   ABDOMEN: Soft and obese. Normoactive bowel sounds present in all four quadrants. No tenderness noted. No masses noted.   EXTREMITIES: +2 pedal pulses bilaterally. No cyanosis, clubbing, or edema.   NEUROLOGIC: Cranial nerves II through XII are grossly intact. Gait was normal and steady. Sensation intact. No hypo or hyperreflexia noted.   LABORATORY, DIAGNOSTIC, AND RADIOLOGICAL DATA: BMP, LFTs, TSH within normal limits. White blood cell count 5.9, hemoglobin 12.6, platelet count 85.    DIAGNOSES:  AXIS I:  1. Mood disorder with psychotic features. 2. PTSD.   AXIS II: Mild mental retardation.   AXIS III:  1. DiGeorge syndrome.  2. 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  3. History of cerebrovascular accident and seizures as an infant.   AXIS IV: Moderate to severe. Inability to care for self independently, chronic medical conditions including DiGeorge syndrome.   AXIS V: GAF at present equals 25.   ASSESSMENT AND TREATMENT RECOMMENDATIONS: Ms. Hamill is a 24 year old Caucasian female who was brought to the Emergency Room secondary to having auditory and visual hallucinations as well as paranoid thoughts. She is not feeling comfortable or safe returning to the group home and is having urges to cut herself. She does have a history of multiple overdoses in the past. Will admit to Inpatient Psychiatry for medication management, safety, and stabilization and place on suicide precautions and close observation. 1. Mood disorder with psychotic features and PTSD. The patient is currently on Seroquel 600 mg p.o. nightly for mood stabilization and psychosis and Prozac 60  mg p.o. daily. Will suspend Prozac for now. The patient's mother and legal guardian is going to try to bring information regarding past psychotropic medication trials. Will also try to get information from [facility] regarding past hospitalizations and medication trials. Will check lipid panel in the a.m. as well as B12 and folic acid. Will also check EKG to rule out QTc prolongation.  2. DiGeorge syndrome history and tetralogy of Fallot. Will check EKG.  3. History of seizure disorder. The patient has had seizures as an infant. No recent seizure activity. 4. Thrombocytopenia. The patient has a platelet count of 85. Will recheck platelets in a.m. Consider Medicine consult for thrombocytopenia. 5. Recent urinary tract infection. Urinalysis is nitrite and leukocyte esterase negative with 2 WBCs and 1+ bacteria. Will hold off on starting antibiotic at this time.  6. Disposition. The patient has a stable living situation and can  return back to the group home once discharged. Mental health follow-up will be with [facility]. Her mother does plan to sign her in to the hospital as her mother is her legal guardian.   TIME SPENT: 75 minutes including talking with patients mother and legal guardian ____________________________ Doralee AlbinoAarti K. Maryruth BunKapur, MD akk:drc D: 12/23/2011 14:08:24 ET T: 12/23/2011 14:32:39 ET JOB#: [id number]  cc: Lelon Ikard K. Maryruth BunKapur, MD, <Dictator> Darliss RidgelAARTI K Partick Musselman MD ELECTRONICALLY SIGNED 12/23/2011 16:13
